# Patient Record
Sex: Female | Born: 1955 | Race: White | Hispanic: No | Marital: Married | Smoking: Former smoker
Health system: Southern US, Community
[De-identification: ages and names within clinical notes are randomized; demographics above are authoritative.]

## PROBLEM LIST (undated history)

## (undated) DIAGNOSIS — I10 Essential (primary) hypertension: Secondary | ICD-10-CM

## (undated) DIAGNOSIS — E785 Hyperlipidemia, unspecified: Secondary | ICD-10-CM

## (undated) DIAGNOSIS — F419 Anxiety disorder, unspecified: Secondary | ICD-10-CM

## (undated) DIAGNOSIS — E039 Hypothyroidism, unspecified: Secondary | ICD-10-CM

## (undated) DIAGNOSIS — E079 Disorder of thyroid, unspecified: Secondary | ICD-10-CM

## (undated) DIAGNOSIS — F329 Major depressive disorder, single episode, unspecified: Secondary | ICD-10-CM

## (undated) DIAGNOSIS — Z9889 Other specified postprocedural states: Secondary | ICD-10-CM

## (undated) DIAGNOSIS — K219 Gastro-esophageal reflux disease without esophagitis: Secondary | ICD-10-CM

## (undated) DIAGNOSIS — F32A Depression, unspecified: Secondary | ICD-10-CM

## (undated) DIAGNOSIS — N200 Calculus of kidney: Secondary | ICD-10-CM

## (undated) DIAGNOSIS — G4730 Sleep apnea, unspecified: Secondary | ICD-10-CM

## (undated) DIAGNOSIS — K589 Irritable bowel syndrome without diarrhea: Secondary | ICD-10-CM

## (undated) DIAGNOSIS — R112 Nausea with vomiting, unspecified: Secondary | ICD-10-CM

## (undated) DIAGNOSIS — C801 Malignant (primary) neoplasm, unspecified: Secondary | ICD-10-CM

## (undated) HISTORY — DX: Essential (primary) hypertension: I10

## (undated) HISTORY — DX: Gastro-esophageal reflux disease without esophagitis: K21.9

## (undated) HISTORY — DX: Disorder of thyroid, unspecified: E07.9

## (undated) HISTORY — PX: ABDOMINAL HYSTERECTOMY: SHX81

## (undated) HISTORY — PX: CHOLECYSTECTOMY: SHX55

## (undated) HISTORY — DX: Malignant (primary) neoplasm, unspecified: C80.1

## (undated) HISTORY — DX: Hyperlipidemia, unspecified: E78.5

## (undated) HISTORY — DX: Irritable bowel syndrome without diarrhea: K58.9

## (undated) HISTORY — DX: Morbid (severe) obesity due to excess calories: E66.01

---

## 2007-06-18 HISTORY — PX: THYROIDECTOMY: SHX17

## 2008-06-17 HISTORY — PX: NASAL SEPTUM SURGERY: SHX37

## 2011-08-28 ENCOUNTER — Encounter (INDEPENDENT_AMBULATORY_CARE_PROVIDER_SITE_OTHER): Payer: Self-pay | Admitting: Surgery

## 2011-08-28 ENCOUNTER — Ambulatory Visit (INDEPENDENT_AMBULATORY_CARE_PROVIDER_SITE_OTHER): Payer: Managed Care, Other (non HMO) | Admitting: Surgery

## 2011-08-28 NOTE — Progress Notes (Signed)
Re:   Joanna Meyer DOB:   January 08, 1956 MRN:   [id number]  ASSESSMENT AND PLAN: 1.  Morbid obesity, Wt - 253, BMI - 47.03  Somewhat undecided about which operation she wants.  I spent 30 minutes going over the options with her.  Per the 1991 NIH Consensus Statement, the patient is a candidate for bariatric surgery.  The patient attended our initial information session and reviewed the types of bariatric surgery.    The patient is uncertain between the Roux en Y Gastric Bypass and a Lap Band.  I discussed with the patient the indications and risks of bariatric surgery.  The potential risks of surgery include, but are not limited to, bleeding, infection, leak from the bowel, DVT and PE, open surgery, long term nutrition consequences, and death.  She was given permits for both operations.  She can make a decision when the surgery is posted.  The patient understands the importance of compliance and long term follow-up with our group after surgery.  From here we will obtain lab tests, x-rays, nutrition consult, and psych consult.  2.  Hypertension. 2.  Questionable sleep apnea, but she would not wear the device, so there is no reason to test her. 4.  Irritable bowel syndrome. 5.  GERD. 6.  Remote kidney stones. 7.  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 8.  Thyroid cancer - 2008.  Disease free.  Chief Complaint  Patient presents with  . Other    new bariatric- unsure of which procedure   REFERRING PHYSICIAN: Dr. Remus Blake Vurlicer, [place] (FAX: [phone]) and the insurance company.  HISTORY OF PRESENT ILLNESS: Joanna Meyer is a 56 y.o. (DOB: 1955/11/26)  white female whose primary care physician is Dr. Remus Blake Vurlicer, [place], [place]. (near [place]), and comes to me today for bariatric surgery.  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  She has been to our information session in her Dr. Andrey Campanile speak. Her son had a LAP-BAND  performed in [place] has lost over 100 pounds. She has coworkers who have had a bypass and a successful. Her one concern is that she does not like things on her skin.  She has tried multiple diets. This includes Weight Watchers (she tries almost every year), [place] weight loss, [employer], grapefruit diet, and lean line. She also tried Meridia and lost about 80 pounds, but gained it back.  She does have some GERD, but no gastric ulcer.  No history of liver disease.  History of cholecystectomy in the 1990's.  No history of pancreas disease.  No history of colon disease. She does have irritable bowel syndrome. She had an upper endo and a colonoscopy about 3 years ago.   Past Medical History  Diagnosis Date  . Thyroid disease   . IBS (irritable bowel syndrome)   . Hypertension   . Hyperlipidemia   . Cancer     thyroid  . GERD (gastroesophageal reflux disease)   . Arthritis       Past Surgical History  Procedure Date  . Cholecystectomy   . Abdominal hysterectomy   . Nasal septum surgery 2010  . Thyroidectomy       Current Outpatient Prescriptions  Medication Sig Dispense Refill  . amLODipine (NORVASC) 10 MG tablet daily.      . diphenoxylate-atropine (LOMOTIL) 2.5-0.025 MG per tablet Take 1 tablet by mouth 4 (four) times daily as needed.      Marland Kitchen  levothyroxine (SYNTHROID, LEVOTHROID) 175 MCG tablet Take 175 mcg by mouth daily.      Marland Kitchen losartan (COZAAR) 100 MG tablet daily.      Marland Kitchen venlafaxine (EFFEXOR-XR) 150 MG 24 hr capsule Take 150 mg by mouth daily.          Allergies  Allergen Reactions  . Tylenol (Acetaminophen)     REVIEW OF SYSTEMS: Skin:  No history of rash.  No history of abnormal moles. Infection:  No history of hepatitis or HIV.  No history of MRSA. Neurologic:  No history of stroke.  No history of seizure.  No history of headaches. Cardiac:  Hypertension x 5 years.. No history of heart disease.  No history of prior cardiac catheterization.  No history of  seeing a cardiologist. Pulmonary:  Does not smoke cigarettes.  No asthma or bronchitis.  No OSA/CPAP.  Endocrine:  No diabetes. No thyroid disease. Gastrointestinal:  See HPI. GYN:  History of hysterectomy. Urologic:  No history of kidney stones.  No history of bladder infections. Musculoskeletal: Knees and back ache, but she is not actively seeing a physician. Hematologic:  No bleeding disorder.  No history of anemia.  Not anticoagulated. Psycho-social:  The patient is oriented.   The patient has no obvious psychologic or social impairment to understanding our conversation and plan.  SOCIAL and FAMILY HISTORY: Married - husband with her.  He just had a subtotal colectomy. 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  PHYSICAL EXAM: BP 136/98  Pulse 92  Temp(Src) 98.2 F (36.8 C) (Temporal)  Resp 20  Ht 5' 1.5" (1.562 m)  Wt 253 lb (114.76 kg)  BMI 47.03 kg/m2  General: WN obese WF who is alert and generally healthy appearing.  HEENT: Normal. Pupils equal. Good dentition. Neck: Supple. No mass. Neck scar well healed. Carotid pulse okay with no bruit. Lymph Nodes:  No supraclavicular or cervical nodes. Lungs: Clear to auscultation and symmetric breath sounds. Heart:  RRR. No murmur or rub. Breasts:  No mass.  Abdomen: Soft. No mass. No tenderness. No hernia. Normal bowel sounds. Scars from lap chole.  More pear than apple.  Rectal: Not done, history of recent colonoscopy. Extremities:  Good strength and ROM  in upper and lower extremities. Neurologic:  Grossly intact to motor and sensory function. Psychiatric: Has normal mood and affect. Behavior is normal.   DATA REVIEWED: Chart records  Ovidio Kin, MD,  [facility], [place] [address] [place]  [address]   [place], [place]    [address] Phone:  [phone] FAX:  [phone]

## 2011-09-19 ENCOUNTER — Ambulatory Visit (HOSPITAL_COMMUNITY): Payer: Managed Care, Other (non HMO)

## 2011-10-04 ENCOUNTER — Ambulatory Visit (HOSPITAL_COMMUNITY)
Admission: RE | Admit: 2011-10-04 | Discharge: 2011-10-04 | Disposition: A | Discharge status: Home / Self Care | Payer: Managed Care, Other (non HMO) | Source: Ambulatory Visit | Attending: Surgery | Admitting: Surgery

## 2011-10-04 ENCOUNTER — Other Ambulatory Visit: Payer: Self-pay

## 2011-10-04 DIAGNOSIS — I10 Essential (primary) hypertension: Secondary | ICD-10-CM | POA: Insufficient documentation

## 2011-10-04 DIAGNOSIS — N200 Calculus of kidney: Secondary | ICD-10-CM | POA: Insufficient documentation

## 2011-10-04 DIAGNOSIS — Z6841 Body Mass Index (BMI) 40.0 and over, adult: Secondary | ICD-10-CM | POA: Insufficient documentation

## 2011-10-04 DIAGNOSIS — K589 Irritable bowel syndrome without diarrhea: Secondary | ICD-10-CM | POA: Insufficient documentation

## 2011-10-04 DIAGNOSIS — K219 Gastro-esophageal reflux disease without esophagitis: Secondary | ICD-10-CM | POA: Insufficient documentation

## 2011-10-22 ENCOUNTER — Encounter (HOSPITAL_COMMUNITY)
Admission: RE | Disposition: A | Discharge status: Home Health | Payer: Self-pay | Source: Ambulatory Visit | Attending: Surgery

## 2011-10-22 ENCOUNTER — Ambulatory Visit (HOSPITAL_COMMUNITY)
Admission: RE | Admit: 2011-10-22 | Discharge: 2011-10-22 | Disposition: A | Discharge status: Home Health | Payer: Managed Care, Other (non HMO) | Source: Ambulatory Visit | Attending: Surgery | Admitting: Surgery

## 2011-10-22 DIAGNOSIS — Z01818 Encounter for other preprocedural examination: Secondary | ICD-10-CM | POA: Insufficient documentation

## 2011-10-22 HISTORY — PX: BREATH TEK H PYLORI: SHX5422

## 2011-10-22 SURGERY — BREATH TEST, FOR HELICOBACTER PYLORI
Anesthesia: Choice

## 2011-10-23 ENCOUNTER — Encounter (HOSPITAL_COMMUNITY): Payer: Self-pay

## 2011-10-23 ENCOUNTER — Encounter (HOSPITAL_COMMUNITY): Payer: Self-pay | Admitting: Surgery

## 2011-11-12 ENCOUNTER — Telehealth (INDEPENDENT_AMBULATORY_CARE_PROVIDER_SITE_OTHER): Payer: Self-pay

## 2011-11-12 NOTE — Telephone Encounter (Signed)
I called the pt back.  She had her ugi and she has reflux.  She wants to know if Dr Ezzard Standing will treat her or should her medical md do it?  She has taken Nexium before but it didn't help.  Her md is Dr Kandice Hams [phone].

## 2011-11-12 NOTE — Telephone Encounter (Signed)
Message copied by Ivory Broad on Tue Nov 12, 2011 11:28 AM ------      Message from: Leandrew Koyanagi      Created: Tue Nov 12, 2011 11:07 AM      Regarding: RX question       Cathlean Sauer,            I told the patient I am not clinical so am unable to answer RX questions. Can you help her?            Thanks,            Okey Regal                  From: Stevan Born [[email or web address]]      Sent: Tuesday, Nov 12, 2011 10:53 AM      To: Leandrew Koyanagi      Subject: Medicine                  Please call me today at [phone] I need a guestion answered about a RX from the dr.

## 2011-11-13 ENCOUNTER — Encounter: Payer: Self-pay | Admitting: *Deleted

## 2011-11-13 ENCOUNTER — Encounter: Payer: Managed Care, Other (non HMO) | Attending: Surgery | Admitting: *Deleted

## 2011-11-13 DIAGNOSIS — Z01818 Encounter for other preprocedural examination: Secondary | ICD-10-CM | POA: Insufficient documentation

## 2011-11-13 DIAGNOSIS — Z713 Dietary counseling and surveillance: Secondary | ICD-10-CM | POA: Insufficient documentation

## 2011-11-13 NOTE — Progress Notes (Signed)
Medical Nutrition Therapy:  Appt start time:  930   End time:  1100.  Pre-Op Gastric Bypass Surgery Visit: 3rd Month Supervised Weight Loss RD taught how to eat; pt has been following portion control, reading food labels, how to shop in grocery store; made plan to decrease sweetened beverages and foods, increase exercise, and to avoid meal skipping. Plan included 1200 calories a day, better food choices, and exercise. Pt states she is trying to follow and has made changes to dietary intake. Pt reports extreme IBS with all foods - chronic, yet inconsistent; exacerbated by stress.   Pre-Op Assessment Visit:  Pre-Operative RYGB Surgery Patient was seen on 11/13/2011 for Pre-Operative RYGB Nutrition Assessment. Assessment and letter of approval faxed to [facility] B[facility]ric [facility] Program coordinator on 11/13/2011.  Approval letter sent to [facility] and will be available in the chart under the media tab.  TANITA  BODY COMP RESULTS  11/13/11   Fat Mass (lbs) 129.0   Fat Free Mass (lbs) 122.5   Total Body Water (lbs) 89.5   Handouts given during visit include: (additional handouts given d/t patient living in [place], [place])  Pre-Op Goals   Bariatric Surgery Protein Shakes  2 Week Bariatric Surgery Pre-Op Diet  Gastric Bypass Post-Op Diet  Gastric Bypass Patient Education Handbook  Samples given during visit include:  Celebrate Vitamins  MVI (pineapple-strawberry): 2 ea - Lot # [id number]; Exp: 09/14  Sublingual B-12: 3 ea - Lot # [id number]; Exp: 07/14 TwinLab Protein Powder  Chocolate: 1 pkt - Lot # [id number]; Exp: 10/14  Vanilla: 1 pkt - Lot # [id number]; Exp: 10/14 Unjury Protein Powder  Unflavored: 1 pkt - Lot # [id number]; Exp: 05/14  Chicken Soup: 2 pkts - Lot # [id number]; Exp: 01/14  Patient to call for Pre-Op and Post-Op Nutrition Education at [facility] when surgery is scheduled.

## 2011-11-13 NOTE — Patient Instructions (Signed)
   Follow Pre-Op Nutrition Goals to prepare for Gastric Bypass Surgery.   Call the [facility] at [phone] or (email [email or web address] once you have been given your surgery date to enrolled in the Pre-Op Nutrition Class. You will need to attend this nutrition class 3-4 weeks prior to your surgery.

## 2011-11-14 ENCOUNTER — Telehealth (INDEPENDENT_AMBULATORY_CARE_PROVIDER_SITE_OTHER): Payer: Self-pay

## 2011-11-14 NOTE — Telephone Encounter (Signed)
Per Dr Ezzard Standing, the patient's medical md needs to treat her reflux.  I faxed the ugi report to Dr Kandice Hams.  We will notify the patient.

## 2012-01-21 ENCOUNTER — Other Ambulatory Visit (INDEPENDENT_AMBULATORY_CARE_PROVIDER_SITE_OTHER): Payer: Self-pay | Admitting: Surgery

## 2012-01-28 ENCOUNTER — Encounter (HOSPITAL_COMMUNITY): Payer: Self-pay | Admitting: Pharmacy Technician

## 2012-01-29 ENCOUNTER — Encounter (INDEPENDENT_AMBULATORY_CARE_PROVIDER_SITE_OTHER): Payer: Self-pay | Admitting: Surgery

## 2012-01-29 ENCOUNTER — Ambulatory Visit (INDEPENDENT_AMBULATORY_CARE_PROVIDER_SITE_OTHER): Payer: Managed Care, Other (non HMO) | Admitting: Surgery

## 2012-01-29 DIAGNOSIS — Z6841 Body Mass Index (BMI) 40.0 and over, adult: Secondary | ICD-10-CM

## 2012-01-29 NOTE — Progress Notes (Addendum)
Re:   Joanna Meyer DOB:   1956/05/24 MRN:   [id number]  ASSESSMENT AND PLAN: 1.  Morbid obesity, Wt - 257, BMI - 47.8  She has decided to go ahead with the RYGB.  Per the 1991 NIH Consensus Statement, the patient is a candidate for bariatric surgery.  The patient attended our initial information session and reviewed the types of bariatric surgery.    I discussed with the patient the indications and risks of bariatric surgery.  The potential risks of surgery include, but are not limited to, bleeding, infection, leak from the bowel, DVT and PE, open surgery, long term nutrition consequences, and death.    She is scheduled for 02/10/2012 for RYGB.  I gave her the prescription for the bowel prep.  She has not started her pre op diet.  She as gained a little weight since I saw her, which I pointed out.  She comes with her husband.    2.  Hypertension. 2.  Questionable sleep apnea, but she would not wear the device, so there is no reason to test her. 4.  Irritable bowel syndrome. 5.  GERD. 6.  Remote kidney stones. 7.  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 8.  Thyroid cancer - 2008.  Disease free.  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  DN 02/13/2012]  Chief Complaint  Patient presents with  . Bariatric Pre-op   REFERRING PHYSICIAN: Dr. Remus Blake Vurlicer, [place], [place]. (FAX: [phone]) and the insurance company.  HISTORY OF PRESENT ILLNESS: Joanna Meyer is a 56 y.o. (DOB: 1956-01-01)  white female whose primary care physician is Dr. Remus Blake Vurlicer, [place], [place]. (near [place]), and comes to me today for bariatric surgery.  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  She has been to our information session in her Dr. Andrey Campanile speak. Her son had a LAP-BAND performed in [place] has lost over 100 pounds.   She decided on the RYGB, in part, because of her discussion with the psychologist.  I spent  time going over the operation, the post op recovery, and potential complications.  She seems to have a good handle on the surgery, but talked about not wanting to be in pain. She had trouble getting [place]  I told her I would err on the side of keeping her longer because she is from [place].  UGI - 10/04/2011 - small sliding HH [employer] - 10/22/2011 - neg Psych - Barnett Abu - supported  (heis in [place] and had a lap band!)  She does have some GERD, but no gastric ulcer.  History of cholecystectomy in the 1990's.  No history of pancreas disease.  No history of colon disease. She does have irritable bowel syndrome. She had an upper endo and a colonoscopy about 3 years ago.  She has had an abdominal hysterectomy.   Past Medical History  Diagnosis Date  . Thyroid disease   . IBS (irritable bowel syndrome)   . Hypertension   . Hyperlipidemia   . Cancer     thyroid  . GERD (gastroesophageal reflux disease)   . Arthritis   . Morbid obesity       Past Surgical History  Procedure Date  . Cholecystectomy   . Abdominal hysterectomy   . Nasal septum surgery 2010  . Thyroidectomy   . Breath tek h  pylori 10/22/2011    Procedure: BREATH TEK H PYLORI;  Surgeon: Kandis Cocking, MD;  Location: [facility];  Service: General;  Laterality: N/A;      Current Outpatient Prescriptions  Medication Sig Dispense Refill  . amLODipine (NORVASC) 10 MG tablet Take 10 mg by mouth every morning.       . diphenoxylate-atropine (LOMOTIL) 2.5-0.025 MG per tablet Take 1 tablet by mouth 4 (four) times daily as needed. Diarrhea      . levothyroxine (SYNTHROID, LEVOTHROID) 175 MCG tablet Take 175 mcg by mouth daily before breakfast.       . losartan (COZAAR) 100 MG tablet Take 100 mg by mouth every morning.       . Metaxalone (SKELAXIN PO) Take by mouth.      . venlafaxine (EFFEXOR-XR) 150 MG 24 hr capsule Take 150 mg by mouth every morning.           Allergies  Allergen  Reactions  . Tylenol (Acetaminophen) Nausea Only and Other (See Comments)    Passed out   Allergy to Tylenol is a little unclear.  REVIEW OF SYSTEMS:  Cardiac:  Hypertension x 5 years.. No history of heart disease.   GYN:  History of hysterectomy. Urologic:  No history of kidney stones.  No history of bladder infections. Musculoskeletal: Knees and back ache, but she is not actively seeing a physician. Psycho-social:  Saw Dr. Hermelinda Dellen of [facility].  SOCIAL and FAMILY HISTORY: Married - husband is with her.  He just had a subtotal colectomy. 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  PHYSICAL EXAM: BP 110/82  Pulse 102  Temp 97.7 F (36.5 C) (Oral)  Resp 24  Ht 5' 1.5" (1.562 m)  Wt 257 lb (116.574 kg)  BMI 47.77 kg/m2  General: WN obese WF who is alert and generally healthy appearing.  HEENT: Normal. Pupils equal. Good dentition. Neck: Supple. No mass. Neck scar well healed. Carotid pulse okay with no bruit. Lungs: Clear to auscultation and symmetric breath sounds. Heart:  RRR. No murmur or rub.  Abdomen: Soft. No mass. No tenderness. No hernia. Normal bowel sounds. Scars from lap chole.  More pear than apple.  Scar in the lower abdomen from hysterectomy. Extremities:  Good strength and ROM  in upper and lower extremities. Neurologic:  Grossly intact to motor and sensory function. Psychiatric: Has normal mood and affect. Behavior is normal.   DATA REVIEWED: See HPI.  Ovidio Kin, MD,  [facility] [place] [address] [place]  [address]   [place], [place]    [address] Phone:  [phone] FAX:  [phone]

## 2012-02-06 NOTE — Pre-Procedure Instructions (Signed)
10-04-2011 ekg eipic

## 2012-02-06 NOTE — Patient Instructions (Addendum)
[address]  02/06/2012   Your procedure is scheduled on:  02-10-2012  Report to [facility] at   [phone].  Call this number if you have problems the morning of surgery: [phone]   Remember:   Do not eat food or drink liquids:After Midnight.  .  Take these medicines the morning of surgery with A SIP OF WATER: effexor, norvasc, levothryoid   Do not wear jewelry or make up.  Do not wear lotions, powders, or perfumes.Do not wear deodorant.    Do not bring valuables to the hospital.  Contacts, dentures or bridgework may not be worn into surgery.  Leave suitcase in the car. After surgery it may be brought to your room.  For patients admitted to the hospital, checkout time is 11:00 AM the day of discharge                             Patients discharged the day of surgery will not be allowed to drive home. If going home same day of surgery, you must have someone stay with you the first 24 hours at home and arrange for some one to drive you home from hospital.    Special Instructions: CHG Shower Use Special Wash: 1/2 bottle night before surgery and 1/2 bottle morning  of surgery, use regular soap on face and front and back private area. Women do not shave legs or underarms for 2 days before showers. Men may shave face morning of surgery.    Please read over the following fact sheets that you were given: MRSA Information  [facility] pre op nurse phone number [phone], call if needed

## 2012-02-07 ENCOUNTER — Ambulatory Visit (HOSPITAL_COMMUNITY)
Admission: RE | Admit: 2012-02-07 | Discharge: 2012-02-07 | Disposition: A | Discharge status: Home / Self Care | Payer: Managed Care, Other (non HMO) | Source: Ambulatory Visit | Attending: Otolaryngology | Admitting: Otolaryngology

## 2012-02-07 ENCOUNTER — Encounter (HOSPITAL_COMMUNITY)
Admission: RE | Admit: 2012-02-07 | Discharge: 2012-02-07 | Disposition: A | Discharge status: Home / Self Care | Payer: Managed Care, Other (non HMO) | Source: Ambulatory Visit | Attending: Surgery | Admitting: Surgery

## 2012-02-07 ENCOUNTER — Encounter (HOSPITAL_COMMUNITY): Payer: Self-pay

## 2012-02-07 DIAGNOSIS — K219 Gastro-esophageal reflux disease without esophagitis: Secondary | ICD-10-CM | POA: Insufficient documentation

## 2012-02-07 DIAGNOSIS — I10 Essential (primary) hypertension: Secondary | ICD-10-CM | POA: Insufficient documentation

## 2012-02-07 DIAGNOSIS — Z01812 Encounter for preprocedural laboratory examination: Secondary | ICD-10-CM | POA: Insufficient documentation

## 2012-02-07 HISTORY — DX: Anxiety disorder, unspecified: F41.9

## 2012-02-07 HISTORY — DX: Sleep apnea, unspecified: G47.30

## 2012-02-07 HISTORY — DX: Hypothyroidism, unspecified: E03.9

## 2012-02-07 HISTORY — DX: Major depressive disorder, single episode, unspecified: F32.9

## 2012-02-07 HISTORY — DX: Nausea with vomiting, unspecified: R11.2

## 2012-02-07 HISTORY — DX: Depression, unspecified: F32.A

## 2012-02-07 HISTORY — DX: Calculus of kidney: N20.0

## 2012-02-07 HISTORY — DX: Other specified postprocedural states: Z98.890

## 2012-02-07 LAB — BASIC METABOLIC PANEL
CO2: 29 mEq/L (ref 19–32)
Calcium: 10.1 mg/dL (ref 8.4–10.5)
Chloride: 102 mEq/L (ref 96–112)
GFR calc Af Amer: >90 mL/min (ref >90)
Sodium: 139 mEq/L (ref 135–145)

## 2012-02-07 LAB — CBC
HCT: 40.9 % (ref 36.0–46.0)
Hemoglobin: 13.7 g/dL (ref 12.0–15.0)
WBC: 8.1 10*3/uL (ref 4.0–10.5)

## 2012-02-07 LAB — SURGICAL PCR SCREEN: Staphylococcus aureus: NEGATIVE

## 2012-02-07 NOTE — Progress Notes (Signed)
02/07/12 1124  OBSTRUCTIVE SLEEP APNEA  Have you ever been diagnosed with sleep apnea through a sleep study? No  Do you snore loudly (loud enough to be heard through closed doors)?  1  Do you often feel tired, fatigued, or sleepy during the daytime? 1  Has anyone observed you stop breathing during your sleep? 1  Do you have, or are you being treated for high blood pressure? 1  BMI more than 35 kg/m2? 1  Age over 54 years old? 1  Neck circumference greater than 40 cm/18 inches? 0  Gender: 0  Obstructive Sleep Apnea Score 6   Score 4 or greater  Updated health history;Results sent to PCP

## 2012-02-10 ENCOUNTER — Encounter (HOSPITAL_COMMUNITY)
Admission: RE | Disposition: A | Discharge status: Home / Self Care | Payer: Self-pay | Source: Ambulatory Visit | Attending: Surgery

## 2012-02-10 ENCOUNTER — Encounter (HOSPITAL_COMMUNITY): Payer: Self-pay | Admitting: *Deleted

## 2012-02-10 ENCOUNTER — Encounter (HOSPITAL_COMMUNITY): Payer: Self-pay | Admitting: Certified Registered Nurse Anesthetist

## 2012-02-10 ENCOUNTER — Encounter (HOSPITAL_COMMUNITY): Payer: Self-pay | Admitting: Surgery

## 2012-02-10 ENCOUNTER — Ambulatory Visit (HOSPITAL_COMMUNITY): Payer: Managed Care, Other (non HMO) | Admitting: Certified Registered Nurse Anesthetist

## 2012-02-10 ENCOUNTER — Inpatient Hospital Stay (HOSPITAL_COMMUNITY)
Admission: RE | Admit: 2012-02-10 | Discharge: 2012-02-13 | DRG: 621 | Disposition: A | Discharge status: Home / Self Care | Payer: Managed Care, Other (non HMO) | Source: Ambulatory Visit | Attending: Surgery | Admitting: Surgery

## 2012-02-10 DIAGNOSIS — Z79899 Other long term (current) drug therapy: Secondary | ICD-10-CM

## 2012-02-10 DIAGNOSIS — K219 Gastro-esophageal reflux disease without esophagitis: Secondary | ICD-10-CM | POA: Diagnosis present

## 2012-02-10 DIAGNOSIS — I10 Essential (primary) hypertension: Secondary | ICD-10-CM

## 2012-02-10 DIAGNOSIS — Z6841 Body Mass Index (BMI) 40.0 and over, adult: Secondary | ICD-10-CM

## 2012-02-10 DIAGNOSIS — E785 Hyperlipidemia, unspecified: Secondary | ICD-10-CM | POA: Diagnosis present

## 2012-02-10 DIAGNOSIS — Z85850 Personal history of malignant neoplasm of thyroid: Secondary | ICD-10-CM

## 2012-02-10 DIAGNOSIS — M479 Spondylosis, unspecified: Secondary | ICD-10-CM | POA: Diagnosis present

## 2012-02-10 DIAGNOSIS — K210 Gastro-esophageal reflux disease with esophagitis: Secondary | ICD-10-CM

## 2012-02-10 DIAGNOSIS — G4730 Sleep apnea, unspecified: Secondary | ICD-10-CM | POA: Diagnosis present

## 2012-02-10 DIAGNOSIS — D214 Benign neoplasm of connective and other soft tissue of abdomen: Secondary | ICD-10-CM

## 2012-02-10 DIAGNOSIS — M1710 Unilateral primary osteoarthritis, unspecified knee: Secondary | ICD-10-CM | POA: Diagnosis present

## 2012-02-10 DIAGNOSIS — Z87442 Personal history of urinary calculi: Secondary | ICD-10-CM

## 2012-02-10 HISTORY — PX: GASTRIC ROUX-EN-Y: SHX5262

## 2012-02-10 LAB — HEMOGLOBIN AND HEMATOCRIT, BLOOD
HCT: 37.5 % (ref 36.0–46.0)
Hemoglobin: 12.6 g/dL (ref 12.0–15.0)

## 2012-02-10 SURGERY — LAPAROSCOPIC ROUX-EN-Y GASTRIC
Anesthesia: General | Site: Abdomen | Wound class: Clean Contaminated

## 2012-02-10 MED ORDER — DIPHENHYDRAMINE HCL 50 MG/ML IJ SOLN
25.0000 mg | Freq: Four times a day (QID) | INTRAMUSCULAR | Status: DC | PRN
  Administered 2012-02-10 – 2012-02-13 (×7): 25 mg via INTRAVENOUS
  Filled 2012-02-10 (×7): qty 1

## 2012-02-10 MED ORDER — TISSEEL VH 10 ML EX KIT
PACK | CUTANEOUS | Status: AC
  Filled 2012-02-10: qty 1

## 2012-02-10 MED ORDER — CEFOXITIN SODIUM-DEXTROSE 1-4 GM-% IV SOLR (PREMIX)
INTRAVENOUS | Status: AC
  Filled 2012-02-10: qty 100

## 2012-02-10 MED ORDER — UNJURY CHOCOLATE CLASSIC POWDER
2.0000 [oz_av] | Freq: Four times a day (QID) | ORAL | Status: DC
  Administered 2012-02-12 (×4): 2 [oz_av] via ORAL

## 2012-02-10 MED ORDER — PROPOFOL 10 MG/ML IV EMUL
INTRAVENOUS | Status: DC | PRN
  Administered 2012-02-10: 200 mg via INTRAVENOUS

## 2012-02-10 MED ORDER — SUCCINYLCHOLINE CHLORIDE 20 MG/ML IJ SOLN
INTRAMUSCULAR | Status: DC | PRN
  Administered 2012-02-10: 100 mg via INTRAVENOUS

## 2012-02-10 MED ORDER — MUSCLE RUB 10-15 % EX CREA
TOPICAL_CREAM | CUTANEOUS | Status: DC | PRN
  Administered 2012-02-10: 16:00:00 via TOPICAL
  Filled 2012-02-10: qty 85

## 2012-02-10 MED ORDER — OXYCODONE-ACETAMINOPHEN 5-325 MG/5ML PO SOLN
5.0000 mL | ORAL | Status: DC | PRN
  Administered 2012-02-11 – 2012-02-13 (×6): 10 mL via ORAL
  Filled 2012-02-10 (×7): qty 10

## 2012-02-10 MED ORDER — DEXAMETHASONE SODIUM PHOSPHATE 10 MG/ML IJ SOLN
INTRAMUSCULAR | Status: DC | PRN
  Administered 2012-02-10: 10 mg via INTRAVENOUS

## 2012-02-10 MED ORDER — GLYCOPYRROLATE 0.2 MG/ML IJ SOLN
INTRAMUSCULAR | Status: DC | PRN
  Administered 2012-02-10: .6 mg via INTRAVENOUS

## 2012-02-10 MED ORDER — MIDAZOLAM HCL 5 MG/5ML IJ SOLN
INTRAMUSCULAR | Status: DC | PRN
  Administered 2012-02-10: 2 mg via INTRAVENOUS

## 2012-02-10 MED ORDER — LACTATED RINGERS IR SOLN
Status: DC | PRN
  Administered 2012-02-10: 3000 mL

## 2012-02-10 MED ORDER — ONDANSETRON HCL 4 MG/2ML IJ SOLN
4.0000 mg | INTRAMUSCULAR | Status: DC | PRN
  Administered 2012-02-10: 4 mg via INTRAVENOUS
  Filled 2012-02-10: qty 2

## 2012-02-10 MED ORDER — SCOPOLAMINE 1 MG/3DAYS TD PT72
1.0000 | MEDICATED_PATCH | Freq: Once | TRANSDERMAL | Status: DC | PRN
  Administered 2012-02-10: 1.5 mg via TRANSDERMAL

## 2012-02-10 MED ORDER — ACETAMINOPHEN 160 MG/5ML PO SOLN: 650.0000 mg | ORAL | Status: DC | PRN

## 2012-02-10 MED ORDER — BUPIVACAINE HCL (PF) 0.25 % IJ SOLN
INTRAMUSCULAR | Status: AC
  Filled 2012-02-10: qty 30

## 2012-02-10 MED ORDER — MORPHINE SULFATE 2 MG/ML IJ SOLN
2.0000 mg | INTRAMUSCULAR | Status: DC | PRN
  Administered 2012-02-10 (×2): 4 mg via INTRAVENOUS
  Administered 2012-02-10 – 2012-02-11 (×4): 6 mg via INTRAVENOUS
  Filled 2012-02-10: qty 3
  Filled 2012-02-10 (×2): qty 2
  Filled 2012-02-10 (×5): qty 3

## 2012-02-10 MED ORDER — LIDOCAINE HCL (CARDIAC) 20 MG/ML IV SOLN
INTRAVENOUS | Status: DC | PRN
  Administered 2012-02-10: 100 mg via INTRAVENOUS

## 2012-02-10 MED ORDER — BUPIVACAINE HCL (PF) 0.25 % IJ SOLN
INTRAMUSCULAR | Status: DC | PRN
  Administered 2012-02-10: 30 mL

## 2012-02-10 MED ORDER — FENTANYL CITRATE 0.05 MG/ML IJ SOLN
INTRAMUSCULAR | Status: DC | PRN
  Administered 2012-02-10 (×4): 50 ug via INTRAVENOUS
  Administered 2012-02-10: 100 ug via INTRAVENOUS
  Administered 2012-02-10: 50 ug via INTRAVENOUS
  Administered 2012-02-10: 100 ug via INTRAVENOUS
  Administered 2012-02-10: 50 ug via INTRAVENOUS

## 2012-02-10 MED ORDER — LACTATED RINGERS IV SOLN: INTRAVENOUS | Status: DC

## 2012-02-10 MED ORDER — UNJURY CHICKEN SOUP POWDER: 2.0000 [oz_av] | Freq: Four times a day (QID) | ORAL | Status: DC

## 2012-02-10 MED ORDER — PROMETHAZINE HCL 25 MG/ML IJ SOLN: 6.2500 mg | INTRAMUSCULAR | Status: DC | PRN

## 2012-02-10 MED ORDER — ACETAMINOPHEN 10 MG/ML IV SOLN
1000.0000 mg | Freq: Four times a day (QID) | INTRAVENOUS | Status: AC
  Administered 2012-02-10 – 2012-02-11 (×2): 1000 mg via INTRAVENOUS
  Filled 2012-02-10 (×4): qty 100

## 2012-02-10 MED ORDER — KCL IN DEXTROSE-NACL 20-5-0.45 MEQ/L-%-% IV SOLN
INTRAVENOUS | Status: DC
  Administered 2012-02-10 – 2012-02-13 (×6): via INTRAVENOUS
  Filled 2012-02-10 (×10): qty 1000

## 2012-02-10 MED ORDER — HYDROMORPHONE HCL PF 1 MG/ML IJ SOLN
INTRAMUSCULAR | Status: DC | PRN
  Administered 2012-02-10 (×2): 1 mg via INTRAVENOUS

## 2012-02-10 MED ORDER — OXYCODONE HCL 5 MG PO TABS: 5.0000 mg | ORAL_TABLET | Freq: Once | ORAL | Status: DC | PRN

## 2012-02-10 MED ORDER — OXYCODONE-ACETAMINOPHEN 5-325 MG/5ML PO SOLN: 5.0000 mL | ORAL | Status: DC | PRN

## 2012-02-10 MED ORDER — ONDANSETRON HCL 4 MG/2ML IJ SOLN
INTRAMUSCULAR | Status: DC | PRN
  Administered 2012-02-10: 4 mg via INTRAVENOUS

## 2012-02-10 MED ORDER — OXYCODONE HCL 5 MG/5ML PO SOLN
5.0000 mg | Freq: Once | ORAL | Status: DC | PRN
  Filled 2012-02-10: qty 5

## 2012-02-10 MED ORDER — TISSEEL VH 10 ML EX KIT
PACK | CUTANEOUS | Status: DC | PRN
  Administered 2012-02-10: 10 mL

## 2012-02-10 MED ORDER — DEXTROSE 5 % IV SOLN
2.0000 g | INTRAVENOUS | Status: AC
  Administered 2012-02-10: 2 g via INTRAVENOUS

## 2012-02-10 MED ORDER — LACTATED RINGERS IV SOLN
INTRAVENOUS | Status: DC | PRN
  Administered 2012-02-10 (×4): via INTRAVENOUS

## 2012-02-10 MED ORDER — DEXTROSE 5 % IV SOLN
1.0000 g | Freq: Three times a day (TID) | INTRAVENOUS | Status: AC
  Administered 2012-02-10 (×2): 1 g via INTRAVENOUS
  Filled 2012-02-10 (×2): qty 1

## 2012-02-10 MED ORDER — ROCURONIUM BROMIDE 100 MG/10ML IV SOLN
INTRAVENOUS | Status: DC | PRN
  Administered 2012-02-10: 10 mg via INTRAVENOUS
  Administered 2012-02-10: 20 mg via INTRAVENOUS
  Administered 2012-02-10: 10 mg via INTRAVENOUS
  Administered 2012-02-10: 50 mg via INTRAVENOUS

## 2012-02-10 MED ORDER — HEPARIN SODIUM (PORCINE) 5000 UNIT/ML IJ SOLN
5000.0000 [IU] | Freq: Once | INTRAMUSCULAR | Status: AC
  Administered 2012-02-10: 5000 [IU] via SUBCUTANEOUS
  Filled 2012-02-10: qty 1

## 2012-02-10 MED ORDER — HEPARIN SODIUM (PORCINE) 5000 UNIT/ML IJ SOLN
5000.0000 [IU] | Freq: Three times a day (TID) | INTRAMUSCULAR | Status: DC
  Administered 2012-02-10 – 2012-02-13 (×9): 5000 [IU] via SUBCUTANEOUS
  Filled 2012-02-10 (×12): qty 1

## 2012-02-10 MED ORDER — KCL IN DEXTROSE-NACL 20-5-0.45 MEQ/L-%-% IV SOLN
INTRAVENOUS | Status: AC
  Filled 2012-02-10: qty 1000

## 2012-02-10 MED ORDER — MEPERIDINE HCL 50 MG/ML IJ SOLN: 6.2500 mg | INTRAMUSCULAR | Status: DC | PRN

## 2012-02-10 MED ORDER — ONDANSETRON HCL 4 MG/2ML IJ SOLN
INTRAMUSCULAR | Status: AC
  Administered 2012-02-10: 4 mg
  Filled 2012-02-10: qty 2

## 2012-02-10 MED ORDER — HYDROMORPHONE HCL PF 1 MG/ML IJ SOLN: 0.2500 mg | INTRAMUSCULAR | Status: DC | PRN

## 2012-02-10 MED ORDER — NEOSTIGMINE METHYLSULFATE 1 MG/ML IJ SOLN
INTRAMUSCULAR | Status: DC | PRN
  Administered 2012-02-10: 5 mg via INTRAVENOUS

## 2012-02-10 MED ORDER — UNJURY VANILLA POWDER: 2.0000 [oz_av] | Freq: Four times a day (QID) | ORAL | Status: DC

## 2012-02-10 MED ORDER — SCOPOLAMINE 1 MG/3DAYS TD PT72
MEDICATED_PATCH | TRANSDERMAL | Status: AC
  Filled 2012-02-10: qty 1

## 2012-02-10 MED ORDER — ONDANSETRON HCL 4 MG/2ML IJ SOLN: 4.0000 mg | INTRAMUSCULAR | Status: DC | PRN

## 2012-02-10 SURGICAL SUPPLY — 70 items
APPLICATOR COTTON TIP 6IN STRL (MISCELLANEOUS) IMPLANT
APPLIER CLIP ROT 13.4 12 LRG (CLIP)
BLADE SURG 15 STRL LF DISP TIS (BLADE) ×2 IMPLANT
BLADE SURG 15 STRL SS (BLADE) ×1
CABLE HIGH FREQUENCY MONO STRZ (ELECTRODE) ×6 IMPLANT
CANISTER SUCTION 2500CC (MISCELLANEOUS) ×3 IMPLANT
CLIP APPLIE ROT 13.4 12 LRG (CLIP) IMPLANT
CLIP SUT LAPRA TY ABSORB (SUTURE) ×6 IMPLANT
CLOTH BEACON ORANGE TIMEOUT ST (SAFETY) ×3 IMPLANT
CUTTER LINEAR ENDO ART 45 ETS (STAPLE) ×3 IMPLANT
DECANTER SPIKE VIAL GLASS SM (MISCELLANEOUS) ×3 IMPLANT
DERMABOND ADVANCED (GAUZE/BANDAGES/DRESSINGS) ×1
DERMABOND ADVANCED .7 DNX12 (GAUZE/BANDAGES/DRESSINGS) ×2 IMPLANT
DEVICE SUTURE ENDOST 10MM (ENDOMECHANICALS) ×3 IMPLANT
DISSECTOR BLUNT TIP ENDO 5MM (MISCELLANEOUS) IMPLANT
DRAIN PENROSE 18X1/4 LTX STRL (WOUND CARE) ×3 IMPLANT
DRAPE CAMERA CLOSED 9X96 (DRAPES) ×3 IMPLANT
DUPLOJECT EASY PREP 4ML (MISCELLANEOUS) ×3 IMPLANT
GAUZE SPONGE 4X4 16PLY XRAY LF (GAUZE/BANDAGES/DRESSINGS) ×3 IMPLANT
GLOVE BIOGEL PI IND STRL 7.0 (GLOVE) ×2 IMPLANT
GLOVE BIOGEL PI INDICATOR 7.0 (GLOVE) ×1
GLOVE SURG SIGNA 7.5 PF LTX (GLOVE) ×6 IMPLANT
GOWN STRL NON-REIN LRG LVL3 (GOWN DISPOSABLE) ×3 IMPLANT
GOWN STRL REIN XL XLG (GOWN DISPOSABLE) ×15 IMPLANT
HOVERMATT SINGLE USE (MISCELLANEOUS) ×3 IMPLANT
KIT BASIN OR (CUSTOM PROCEDURE TRAY) ×3 IMPLANT
KIT GASTRIC LAVAGE 34FR ADT (SET/KITS/TRAYS/PACK) ×3 IMPLANT
NEEDLE SPNL 22GX3.5 QUINCKE BK (NEEDLE) ×3 IMPLANT
NS IRRIG 1000ML POUR BTL (IV SOLUTION) ×3 IMPLANT
PACK CARDIOVASCULAR III (CUSTOM PROCEDURE TRAY) ×3 IMPLANT
PEN SKIN MARKING BROAD (MISCELLANEOUS) ×3 IMPLANT
POUCH SPECIMEN RETRIEVAL 10MM (ENDOMECHANICALS) ×3 IMPLANT
RELOAD 45 VASCULAR/THIN (ENDOMECHANICALS) ×6 IMPLANT
RELOAD BLUE (STAPLE) ×6 IMPLANT
RELOAD ENDO STITCH 2.0 (ENDOMECHANICALS) ×11
RELOAD GOLD (STAPLE) IMPLANT
RELOAD STAPLE TA45 3.5 REG BLU (ENDOMECHANICALS) ×12 IMPLANT
RELOAD WHITE ECR60W (STAPLE) IMPLANT
SCALPEL HARMONIC ACE (MISCELLANEOUS) IMPLANT
SCISSORS LAP 5X35 DISP (ENDOMECHANICALS) ×3 IMPLANT
SEALANT SURGICAL APPL DUAL CAN (MISCELLANEOUS) ×3 IMPLANT
SET IRRIG TUBING LAPAROSCOPIC (IRRIGATION / IRRIGATOR) ×3 IMPLANT
SLEEVE Z-THREAD 12X100MM (TROCAR) ×9 IMPLANT
SLEEVE Z-THREAD 5X100MM (TROCAR) ×3 IMPLANT
SOLUTION ANTI FOG 6CC (MISCELLANEOUS) ×3 IMPLANT
SPONGE GAUZE 4X4 12PLY (GAUZE/BANDAGES/DRESSINGS) ×3 IMPLANT
STAPLER STANDARD HANDLE (STAPLE) ×3 IMPLANT
STAPLER VISISTAT 35W (STAPLE) ×3 IMPLANT
SUT MON AB 5-0 PS2 18 (SUTURE) ×3 IMPLANT
SUT RELOAD ENDO STITCH 2 48X1 (ENDOMECHANICALS) ×14
SUT RELOAD ENDO STITCH 2.0 (ENDOMECHANICALS) ×8
SUT VIC AB 2-0 SH 27 (SUTURE) ×2
SUT VIC AB 2-0 SH 27X BRD (SUTURE) ×4 IMPLANT
SUTURE RELOAD END STTCH 2 48X1 (ENDOMECHANICALS) ×14 IMPLANT
SUTURE RELOAD ENDO STITCH 2.0 (ENDOMECHANICALS) ×8 IMPLANT
SYR 20CC LL (SYRINGE) ×3 IMPLANT
SYR 50ML LL SCALE MARK (SYRINGE) ×3 IMPLANT
SYR CONTROL 10ML LL (SYRINGE) ×6 IMPLANT
TOWEL OR 17X26 10 PK STRL BLUE (TOWEL DISPOSABLE) ×6 IMPLANT
TOWEL OR NON WOVEN STRL DISP B (DISPOSABLE) ×3 IMPLANT
TRAY FOLEY CATH 14FRSI W/METER (CATHETERS) ×3 IMPLANT
TROCAR XCEL 12X100 BLDLESS (ENDOMECHANICALS) ×3 IMPLANT
TROCAR XCEL NON-BLD 5MMX100MML (ENDOMECHANICALS) ×3 IMPLANT
TROCAR Z-THREAD FIOS 11X100 BL (TROCAR) ×3 IMPLANT
TROCAR Z-THREAD FIOS 12X100MM (TROCAR) ×3 IMPLANT
TROCAR Z-THREAD FIOS 5X100MM (TROCAR) ×3 IMPLANT
TUBING ENDO SMARTCAP (MISCELLANEOUS) ×3 IMPLANT
TUBING FILTER THERMOFLATOR (ELECTROSURGICAL) ×3 IMPLANT
WARMER LAPAROSCOPE (MISCELLANEOUS) IMPLANT
WATER STERILE IRR 1500ML POUR (IV SOLUTION) ×3 IMPLANT

## 2012-02-10 NOTE — Progress Notes (Signed)
Dr. Ezzard Standing made aware of patient's Tylenol allergy- Tylenol poisoning- and medications  That have been ordered- Tylenol and those with Tylenol in them- Dr. Ezzard Standing to take care of it after he comes out of surgery.

## 2012-02-10 NOTE — Anesthesia Postprocedure Evaluation (Signed)
Anesthesia Post Note  Patient: Joanna Meyer  Procedure(s) Performed: Procedure(s) (LRB): LAPAROSCOPIC ROUX-EN-Y GASTRIC (N/A)  Anesthesia type: General  Patient location: PACU  Post pain: Pain level controlled  Post assessment: Post-op Vital signs reviewed  Last Vitals: BP 122/75  Pulse 83  Temp 36.4 C (Oral)  Resp 16  Ht 5\' 1"  (1.549 m)  Wt 244 lb 8 oz (110.904 kg)  BMI 46.20 kg/m2  SpO2 94%  Post vital signs: Reviewed  Level of consciousness: sedated  Complications: No apparent anesthesia complications

## 2012-02-10 NOTE — Anesthesia Preprocedure Evaluation (Addendum)
Anesthesia Evaluation  Patient identified by MRN, date of birth, ID band Patient awake    Reviewed: Allergy & Precautions, H&P , NPO status , Patient's Chart, lab work & pertinent test results  History of Anesthesia Complications (+) PONV  Airway Mallampati: II TM Distance: >3 FB Neck ROM: Full    Dental  (+) Teeth Intact and Dental Advisory Given   Pulmonary sleep apnea ,  breath sounds clear to auscultation  Pulmonary exam normal       Cardiovascular hypertension, Pt. on medications Rhythm:Regular     Neuro/Psych Anxiety    GI/Hepatic Neg liver ROS, GERD-  Medicated,  Endo/Other  Hypothyroidism Morbid obesity  Renal/GU      Musculoskeletal   Abdominal (+) + obese,   Peds  Hematology negative hematology ROS (+)   Anesthesia Other Findings   Reproductive/Obstetrics                         Anesthesia Physical Anesthesia Plan  ASA: III  Anesthesia Plan: General   Post-op Pain Management:    Induction: Intravenous, Rapid sequence and Cricoid pressure planned  Airway Management Planned: Oral ETT  Additional Equipment:   Intra-op Plan:   Post-operative Plan: Extubation in OR  Informed Consent: I have reviewed the patients History and Physical, chart, labs and discussed the procedure including the risks, benefits and alternatives for the proposed anesthesia with the patient or authorized representative who has indicated his/her understanding and acceptance.   Dental advisory given  Plan Discussed with: CRNA and Surgeon  Anesthesia Plan Comments:        Anesthesia Quick Evaluation

## 2012-02-10 NOTE — Progress Notes (Signed)
Foley catheter removed without difficulty- 1,000 cc of urine in bag

## 2012-02-10 NOTE — Interval H&P Note (Signed)
History and Physical Interval Note:  02/10/2012 7:13 AM  Joanna Meyer  has presented today for surgery, with the diagnosis of morbid obesity   The various methods of treatment have been discussed with the patient and family.  Her husband is here today.  After consideration of risks, benefits and other options for treatment, the patient has consented to  Procedure(s) (LRB): LAPAROSCOPIC ROUX-EN-Y GASTRIC (N/A) as a surgical intervention .    The patient's history has been reviewed, patient examined, no change in status, stable for surgery.  I have reviewed the patient's chart and labs.  Questions were answered to the patient's satisfaction.     Deshae Dickison H

## 2012-02-10 NOTE — Preoperative (Signed)
Beta Blockers   Reason not to administer Beta Blockers:took all meds this morning

## 2012-02-10 NOTE — Op Note (Signed)
PATIENT:   Chany Woolworth DOB:   09-Jun-1956 MRN:   [id number]  DATE OF PROCEDURE: 02/10/2012                   FACILITY:  [facility]  OPERATIVE REPORT  PREOPERATIVE DIAGNOSIS:  Morbid obesity.  POSTOPERATIVE DIAGNOSIS:  Morbid obesity (weight 257, BMI of 47.8), 8 mm anterior gastric wall tumor.  PROCEDURE:  Laparoscopic Roux-en-Y gastric bypass (intraoperative upper endoscopy by Dr. Andrey Campanile), resection of anterior gastric wall tumor.  SURGEON:  Sandria Bales. Ezzard Standing, MD  FIRST ASSISTANTFredonia Highland, MD  ANESTHESIA:  General endotracheal.  Junious Silk - CRNA Gaylan Gerold, MD - Anesthesiologist Mechele Dawley, CRNA - CRNA  General  ESTIMATED BLOOD LOSS:  Minimal.  LOCAL ANESTHESIA:  30 cc of 1/4% Marcaine  COMPLICATIONS:  None.  INDICATION FOR SURGERY:  Lakindra Wible is a 56 y.o. white female who sees Vurlicer, Benny Lennert, MD as her primary care doctor.  She has completed our preoperative bariatric program and now comes for a laparoscopic Roux-en-Y gastric bypass.  The indications, potential complications of surgery explained to the patient.  Potential complications of the surgery include, but are not limited to, bleeding, infection, DVT, open surgery, and long-term nutritional consequences.  OPERATIVE NOTE:  The patient taken to room #1 where she went underwent a general endotracheal anesthetic, supervised by Junious Silk - CRNA and Gaylan Gerold, MD - Anesthesiologist.  The patient was given 2 g of cefoxitin at the beginning of the procedure.  A time-out was held and surgical checklist run.  The abdomen was prepped with ChloraPrep and sterilely draped.  I accessed the abdominal cavity through the left upper quadrant using a 12 mm Optiview trocar.  I placed 6 additional trocars: 5 mm subxiphoid, 12 mm right subcostal, 12 mm right paramedian, 12 mm left paramedian, 5 mm lateral subcostal, and a 11 mm below to the right of the umbilicus.  The abdomen was insufflated and  abdominal exploration carried out.  Right and left lobes of liver unremarkable.  The stomach had an 8 mm tumor on the anterior surface, lateral towards the greater curvature.  She had a moderate amount of greater omentum which draped over her bowel.  I was able to push the omentum and transverse colon up and identified the ligament of Treitz to start the operation.  I measured 40 cm of the jejunum, starting at the ligament of Tritz, and divided the jejunum with a white load of 45 mm Ethicon Endo-GIA stapler.  I divided a short length into the mesentery.  I measured 100 cm of jejunum for the future gastric limb.  I put a Penrose drain on the future gastric limb of the jejunum.  I then did a side-to-side jejunojejunostomy.  I used a 45 mm white load.  I closed the enterotomy with 2 running 2-0 Vicryl sutures.  I tested the JJ anastomosis with an alligator forceps and then covered this with Tisseel.  I closed the mesenteric defect with a running 2-0 silk suture with a Laparo-tye on each end.  I then divided the omentum with a Harmonic Scalpel.  I placed the patient in reverse Trendelenburg and placed liver retractor under the left lobe of the liver.  I then identified the gastroesophageal junction.  I went to the left at the angle of His and made a window at the esophago-gastric junction for a target as my dissection.  I then went on the lesser curve of the stomach, counted  5 cm from the gastroesophageal junction down the lesser curve and dissected into the lesser sac.  I did the first firing of a 45 mm blue load Ethicon Endo-GIA stapler and then did 2 firings of the 60 mm blue load Ethicon Eschelon stapler.  I then did one more firing of the 45 mm blue load Ethicon Endo-GIA stapler to divide the stomach  This created a gastric pouch approximately 5 cm in length and 3 cm in width.  There was no bleeding from either the pouch or the stomach remnant site.  I placed Tisseel on the pouch side along the  new greater curvature.  I then resected the tumor of the anterior gastric wall with a single firing of the 45 mm blue load Ethicon Endo-GIA stapler.  The specimen was placed in an endocatch bag and sent to pathology.  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  I over sewed the gastric remnant with a locking 2-0 Vicryl suture with a Laparo-tye on each end..  I then brought the jejunum ante-colic, ante-gastric up to the new stomach pouch and placed a posterior running 2-0 Vicryl suture.  I then made an enterotomy into the stomach using the Ewald as a back stop and an enterotomy into the jejunum.  At this point I accidentally cut the posterior layer suture, but was able to resew it.  I did a stapled side-to-side gastrojejunal anastomosis with a 45 mm blue load of the Ethicon Endo GIA stapler.  This created about a 2.5 cm gastrojejunal anastomosis.  I closed the enterotomy with a 2 running 2-0 Vicryl sutures.  I passed the Ewald tube through the gastrojejunal anastomosis and then did an anterior Connell suture running of 2-0 Vicryl suture for the anterior layer of the gastrojejunostomy.  The Ewald tube was then removed without difficulty.  I then closed the [place] defect with a figure-of-eight 2-0 silk suture between the mesentery of the transverse colon and the mesentery of the distal jejunum.  Dr. Andrey Campanile then scrubbed out and did an intraoperative upper endoscopy.  He identified the esophagogastric junction about 40 cm, the gastrojejunal anastomosis about 45 cm.  I clamped off the small bowel.  He insufflated air and I flooded the abdomen with saline. There was no bubbling or evidence of air leak.  He then withdrew the scope and he will dictate that portion of the operation.    I then re-inspected the anastomoses, sucked out the saline, placed Tisseel over the stomach pouch and gastrojejunal anastomosis.   The liver retractor was removed.  The trocars were removed.  There was no  bleeding at any trocar site.  The skin at each trocar site was closed with a 5-0 Monocryl suture.  I infiltrated a total 30 cc of 0.25% Marcaine at the trocar sites.    After the skin incisions were closed with sutures they were painted with Dermabond.  The sponge and needle count were correct at the end of the case.  The patient tolerated the procedure well, was transported to the recovery room in good condition.   Ovidio Kin, MD, [facility] Surgery Pager: [phone] Office phone:  [phone]

## 2012-02-10 NOTE — Op Note (Signed)
Preoperative diagnosis: Morbid Obesity BMI 46  Postoperative diagnosis: Same   Procedure: Esophagogastrojejunoscopy   Surgeon: Mary Sella. Jaclene Bartelt M.D., FACS   Anesthesia: Gen.   Indications for procedure: 56 yo morbidly obese WF undergoing Laparoscopic roux-en-y gastric bypass  Description of procedure: After we have completed the new gastrojejunostomy, I scrubbed out and obtained the Olympus endoscope. I gently placed endoscope in the patient's oropharynx and gently glided it down the esophagus without any difficulty under direct visualization. The esophagus appeared slightly patulous. Once I was in the gastric pouch, I insufflated the pouch was air. The pouch was approximately 5 cm in size. Ge junction was at 41 cm. Gastrojejunal anastomosis was at 46cm.  I was able to cannulate and advanced the scope through the gastrojejunostomy. Dr. Ezzard Standing had placed saline in the upper abdomen. Upon further insufflation of the gastric pouch there was no evidence of bubbles. Upon further inspection of the gastric pouch, the mucosa appeared normal. There is no evidence of any mucosal abnormality. The gastric pouch and Roux limb were decompressed. The width of the gastrojejunal anastomosis was at least 2.5 cm. The scope was withdrawn. The patient tolerated this portion of the procedure well. Please see Dr Butch Penny operative note for details regarding the laparoscopic roux-en-y gastric bypass.  Mary Sella. Andrey Campanile, MD, FACS General, Bariatric, & Minimally Invasive Surgery [facility] Surgery, [place]

## 2012-02-10 NOTE — Transfer of Care (Signed)
Immediate Anesthesia Transfer of Care Note  Patient: Joanna Meyer  Procedure(s) Performed: Procedure(s) (LRB): LAPAROSCOPIC ROUX-EN-Y GASTRIC (N/A)  Patient Location: PACU  Anesthesia Type: General  Level of Consciousness: awake, alert  and oriented  Airway & Oxygen Therapy: Patient Spontanous Breathing and Patient connected to face mask oxygen  Post-op Assessment: Report given to PACU RN and Post -op Vital signs reviewed and stable  Post vital signs: Reviewed and stable  Complications: No apparent anesthesia complications

## 2012-02-10 NOTE — Progress Notes (Signed)
NOS  Standing.  Husband in room. Had some nausea earlier, better now. Main complaint is back pain (possibly from laying on the table for a while).  Bengay ordered for back.  Discussed tylenol allergy.  I'm not sure this is an allergy.  She was taking frequent pain meds about 5 years ago and they think the frequency of the tylenol cause some pain.  She thinks she has taken some cold meds that have tylenol, without difficulty.  Ovidio Kin, MD, [facility] Surgery Pager: [phone] Office phone:  [phone]

## 2012-02-10 NOTE — H&P (View-Only) (Signed)
Re:   Joanna Meyer DOB:   January 04, 1956 MRN:   [id number]  ASSESSMENT AND PLAN: 1.  Morbid obesity, Wt - 257, BMI - 47.8  She has decided to go ahead with the RYGB.  Per the 1991 NIH Consensus Statement, the patient is a candidate for bariatric surgery.  The patient attended our initial information session and reviewed the types of bariatric surgery.    I discussed with the patient the indications and risks of bariatric surgery.  The potential risks of surgery include, but are not limited to, bleeding, infection, leak from the bowel, DVT and PE, open surgery, long term nutrition consequences, and death.    She is scheduled for 02/10/2012 for RYGB.  I gave her the prescription for the bowel prep.  She has not started her pre op diet.  She as gained a little weight since I saw her, which I pointed out.  She comes with her husband.    2.  Hypertension. 2.  Questionable sleep apnea, but she would not wear the device, so there is no reason to test her. 4.  Irritable bowel syndrome. 5.  GERD. 6.  Remote kidney stones. 7.  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 8.  Thyroid cancer - 2008.  Disease free.  Chief Complaint  Patient presents with  . Bariatric Pre-op   REFERRING PHYSICIAN: Dr. Remus Blake Vurlicer, [place], [place]. (FAX: [phone]) and the insurance company.  HISTORY OF PRESENT ILLNESS: Joanna Meyer is a 56 y.o. (DOB: Nov 06, 1955)  white female whose primary care physician is Dr. Remus Blake Vurlicer, [place], [place]. (near [place]), and comes to me today for bariatric surgery.  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  She has been to our information session in her Dr. Andrey Campanile speak. Her son had a LAP-BAND performed in [place] has lost over 100 pounds.   She decided on the RYGB, in part, because of her discussion with the psychologist.  I spent time going over the operation, the post op recovery, and potential complications.  She seems to  have a good handle on the surgery, but talked about not wanting to be in pain. She had trouble getting [place] [address]  I told her I would err on the side of keeping her longer because she is from [place].  UGI - 10/04/2011 - small sliding HH [employer] - 10/22/2011 - neg Psych - Barnett Abu - supported  (heis in [place] and had a lap band!)  She does have some GERD, but no gastric ulcer.  History of cholecystectomy in the 1990's.  No history of pancreas disease.  No history of colon disease. She does have irritable bowel syndrome. She had an upper endo and a colonoscopy about 3 years ago.  She has had an abdominal hysterectomy.   Past Medical History  Diagnosis Date  . Thyroid disease   . IBS (irritable bowel syndrome)   . Hypertension   . Hyperlipidemia   . Cancer     thyroid  . GERD (gastroesophageal reflux disease)   . Arthritis   . Morbid obesity       Past Surgical History  Procedure Date  . Cholecystectomy   . Abdominal hysterectomy   . Nasal septum surgery 2010  . Thyroidectomy   . Breath tek h pylori 10/22/2011    Procedure: BREATH TEK H PYLORI;  Surgeon: Kandis Cocking, MD;  Location: [facility];  Service: General;  Laterality: N/A;      Current Outpatient Prescriptions  Medication Sig Dispense Refill  . amLODipine (NORVASC) 10 MG tablet Take 10 mg by mouth every morning.       . diphenoxylate-atropine (LOMOTIL) 2.5-0.025 MG per tablet Take 1 tablet by mouth 4 (four) times daily as needed. Diarrhea      . levothyroxine (SYNTHROID, LEVOTHROID) 175 MCG tablet Take 175 mcg by mouth daily before breakfast.       . losartan (COZAAR) 100 MG tablet Take 100 mg by mouth every morning.       . Metaxalone (SKELAXIN PO) Take by mouth.      . venlafaxine (EFFEXOR-XR) 150 MG 24 hr capsule Take 150 mg by mouth every morning.           Allergies  Allergen Reactions  . Tylenol (Acetaminophen) Nausea Only and Other (See Comments)    Passed out   Allergy  to Tylenol is a little unclear.  REVIEW OF SYSTEMS:  Cardiac:  Hypertension x 5 years.. No history of heart disease.   GYN:  History of hysterectomy. Urologic:  No history of kidney stones.  No history of bladder infections. Musculoskeletal: Knees and back ache, but she is not actively seeing a physician. Psycho-social:  Saw Dr. Hermelinda Dellen of [facility].  SOCIAL and FAMILY HISTORY: Married - husband is with her.  He just had a subtotal colectomy. 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  PHYSICAL EXAM: BP 110/82  Pulse 102  Temp 97.7 F (36.5 C) (Oral)  Resp 24  Ht 5' 1.5" (1.562 m)  Wt 257 lb (116.574 kg)  BMI 47.77 kg/m2  General: WN obese WF who is alert and generally healthy appearing.  HEENT: Normal. Pupils equal. Good dentition. Neck: Supple. No mass. Neck scar well healed. Carotid pulse okay with no bruit. Lungs: Clear to auscultation and symmetric breath sounds. Heart:  RRR. No murmur or rub.  Abdomen: Soft. No mass. No tenderness. No hernia. Normal bowel sounds. Scars from lap chole.  More pear than apple.  Scar in the lower abdomen from hysterectomy. Extremities:  Good strength and ROM  in upper and lower extremities. Neurologic:  Grossly intact to motor and sensory function. Psychiatric: Has normal mood and affect. Behavior is normal.   DATA REVIEWED: See HPI.  Ovidio Kin, MD,  [facility] [place] [address]  [address]   [place], [place]    [address] Phone:  [phone] FAX:  [phone]

## 2012-02-11 ENCOUNTER — Encounter (HOSPITAL_COMMUNITY): Payer: Self-pay | Admitting: Surgery

## 2012-02-11 ENCOUNTER — Inpatient Hospital Stay (HOSPITAL_COMMUNITY): Payer: Managed Care, Other (non HMO)

## 2012-02-11 DIAGNOSIS — Z09 Encounter for follow-up examination after completed treatment for conditions other than malignant neoplasm: Secondary | ICD-10-CM

## 2012-02-11 LAB — CBC WITH DIFFERENTIAL/PLATELET
Basophils Absolute: 0 10*3/uL (ref 0.0–0.1)
Basophils Relative: 0 % (ref 0–1)
HCT: 35.5 % — ABNORMAL LOW (ref 36.0–46.0)
Lymphocytes Relative: 11 % — ABNORMAL LOW (ref 12–46)
MCHC: 33.2 g/dL (ref 30.0–36.0)
Monocytes Absolute: 0.8 10*3/uL (ref 0.1–1.0)
Neutro Abs: 7.9 10*3/uL — ABNORMAL HIGH (ref 1.7–7.7)
Neutrophils Relative %: 81 % — ABNORMAL HIGH (ref 43–77)
Platelets: 328 10*3/uL (ref 150–400)
RDW: 13.8 % (ref 11.5–15.5)
WBC: 9.7 10*3/uL (ref 4.0–10.5)

## 2012-02-11 LAB — HEMOGLOBIN AND HEMATOCRIT, BLOOD
HCT: 34.9 % — ABNORMAL LOW (ref 36.0–46.0)
Hemoglobin: 11.4 g/dL — ABNORMAL LOW (ref 12.0–15.0)

## 2012-02-11 MED ORDER — PHENOL 1.4 % MT LIQD
1.0000 | OROMUCOSAL | Status: DC | PRN
  Administered 2012-02-11: 1 via OROMUCOSAL
  Filled 2012-02-11: qty 177

## 2012-02-11 NOTE — Plan of Care (Signed)
Problem: Phase II Progression Outcomes Goal: Discharge plan remains appropriate-arrangements made Outcome: Completed/Met Date Met:  02/11/12 Plans for discharge home on Thursday.

## 2012-02-11 NOTE — Progress Notes (Signed)
Pt received two doses of IV tylenol, tolerated well, no complaint of adverse reactions and non noted by Nurse.

## 2012-02-11 NOTE — Plan of Care (Signed)
Problem: Phase II Progression Outcomes Goal: Upper GI SWALLOW ACCEPTABLE Outcome: Completed/Met Date Met:  02/11/12 Patient upper gi passed.

## 2012-02-11 NOTE — Plan of Care (Signed)
Problem: Phase II Progression Outcomes Goal: Surgical site without signs of infection Outcome: Completed/Met Date Met:  02/11/12 Patient surgical sites clean and dry, with no redness, swelling or drainage.

## 2012-02-11 NOTE — Plan of Care (Signed)
Problem: Phase II Progression Outcomes Goal: Hemodynamically stable Outcome: Completed/Met Date Met:  02/11/12 Vitals stable

## 2012-02-11 NOTE — Progress Notes (Signed)
General Surgery Note  LOS: 1 day  POD# 1  Assessment/Plan: 1.  LAPAROSCOPIC ROUX-EN-Y GASTRIC, UPPER GI ENDOSCOPY - D. Armani Gawlik - 02/10/2012  Doing well.  Walked a lot last PM  For swallow and doppler's today.  2. Hypertension.  3. Questionable sleep apnea, but she would not wear the device, so there is no reason to test her.  4. Irritable bowel syndrome.  5. GERD.  6. Remote kidney stones.  7. 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  8. Thyroid cancer - 2008. Disease free. 9.  DVT  proph - SQ Heparin  Subjective:  Doing well.  Husband in room.  So far tolerated Tylenol without incident. Objective:   Filed Vitals:   02/11/12 0617  BP: 110/62  Pulse: 90  Temp: 97.6 F (36.4 C)  Resp: 18     Intake/Output from previous day:  08/26 0701 - 08/27 0700 In: 5935 [I.V.:5635; IV Piggyback:300] Out: 5110 [Urine:5060; Blood:50]  Intake/Output this shift:      Physical Exam:   General: WN obese WF who is alert and oriented.    HEENT: Normal. Pupils equal. .   Lungs: Clear    Abdomen: Soft, quiet.   Wound: look good.   Neurologic:  Grossly intact to motor and sensory function.   Psychiatric: Has normal mood and affect.   Lab Results:    Basename 02/11/12 0405 02/10/12 1641  WBC 9.7 --  HGB 11.8* 12.6  HCT 35.5* 37.5  PLT 328 --    BMET  No results found for this basename: NA:2,K:2,CL:2,CO2:2,GLUCOSE:2,BUN:2,CREATININE:2,CALCIUM:2 in the last 72 hours  PT/INR  No results found for this basename: LABPROT:2,INR:2 in the last 72 hours  ABG  No results found for this basename: PHART:2,PCO2:2,PO2:2,HCO3:2 in the last 72 hours   Studies/Results:  No results found.   Anti-infectives:   Anti-infectives     Start     Dose/Rate Route Frequency Ordered Stop   02/10/12 1600   cefOXitin (MEFOXIN) 1 g in dextrose 5 % 50 mL IVPB        1 g 100 mL/hr over 30 Minutes Intravenous 3 times per day 02/10/12 1457 02/10/12 2246   02/10/12 0553   cefOXitin (MEFOXIN) 2 g in  dextrose 5 % 50 mL IVPB        2 g 100 mL/hr over 30 Minutes Intravenous 60 min pre-op 02/10/12 0553 02/10/12 0735          Ovidio Kin, MD, FACS Pager: [phone],   [facility] [facility] Office: [phone] 02/11/2012

## 2012-02-11 NOTE — Plan of Care (Signed)
Problem: Phase II Progression Outcomes Goal: Pulmonary function stable Outcome: Progressing Patient using incentive spirometry appropriately.  Continues to drop sats when sleeping at times.

## 2012-02-11 NOTE — Progress Notes (Signed)
*  PRELIMINARY RESULTS* Vascular Ultrasound Lower extremity venous duplex has been completed.  Preliminary findings: bilaterally no evidence of DVT or baker's cyst.  Farrel Demark, RDMS, RVT  02/11/2012, 11:22 AM

## 2012-02-11 NOTE — Progress Notes (Signed)
Pt alert and oriented; husband at bedside; VSS; remains NPO for UGI; awaiting doppler study; denies any nausea or vomiting; denies flatus or BM at this time; voiding without difficulty; ambulating without difficulty; c/o some abdominal soreness with relief from prn meds; pt already has follow up appts with [facility] and [facility]; aware of support group and BELT program; discharge instructions given for pt to review; questions answered. GASTRIC BYPASS/SLEEVE DISCHARGE INSTRUCTIONS  Drs. Fredrik Rigger, [place], [place], and [place] Call if you have any problems.   Call [phone] and ask for the surgeon on call.    If you need immediate assistance come to the ER at [facility]. Tell the ER personnel that you are a new post-op gastric bypass patient. Signs and symptoms to report:   Severe vomiting or nausea. If you cannot tolerate clear liquids for longer than 1 day, you need to call your surgeon.    Abdominal pain which does not get better after taking your pain medication   Fever greater than 101 F degree   Difficulty breathing   Chest pain    Redness, swelling, drainage, or foul odor at incision sites    If your incisions open or pull apart   Swelling or pain in calf (lower leg)   Diarrhea, frequent watery, uncontrolled bowel movements.   Constipation, (no bowel movements for 3 days) if this occurs, Take Milk of Magnesia, 2 tablespoons by mouth, 3 times a day for 2 days if needed.  Call your doctor if constipation continues. Stop taking Milk of Magnesia once you have had a bowel movement. You may also use Miralax according to the label instructions.   Anything you consider "abnormal for you".   Normal side effects after Surgery:   Unable to sleep at night or concentrate   Irritability   Being tearful (crying) or depressed   These are common complaints, possibly related to your anesthesia, stress of surgery and change in lifestyle, that usually go away a few weeks after surgery.  If these feelings  continue, call your medical doctor.  Wound Care You may have surgical glue, steri-strips, or staples over your incisions after surgery.  Surgical glue:  Looks like a clear film over your incisions and will wear off gradually. Steri-strips: Strips of tape over your incisions. You may notice a yellowish color on the skin underneath the steri-strips. This is a substance used to make the steri-strips stick better. Do not pull the steri-strips off - let them fall off.  Staples: Cherlynn Polo may be removed before you leave the hospital. If you go home with staples, call [facility] [phone]) for an appointment with your surgeon's nurse to have staples removed in 7 - 10 days. Showering: You may shower two days after your surgery unless otherwise instructed by your surgeon. Wash gently around wounds with warm soapy water, rinse well, and gently pat dry.  If you have a drain, you may need someone to hold this while you shower. Avoid tub baths until staples are removed and incisions are healed.    Medications   Medications should be liquid or crushed if larger than the size of a dime.  Extended release pills should not be crushed.   Depending on the size and number of medications you take, you may need to stagger/change the time you take your medications so that you do not over-fill your pouch.    Make sure you follow-up with your primary care physician to make medication adjustments needed during rapid weight loss  and life-style adjustment.   If you are diabetic, follow up with the doctor that prescribes your diabetes medication(s) within one week after surgery and check your blood sugar regularly.   Do not drive while taking narcotics!   Do not take acetaminophen (Tylenol) and Roxicet or Lortab Elixir at the same time since these pain medications contain acetaminophen.  Diet at home: (First 2 Weeks) You will see the nutritionist two weeks after your surgery. She will advance your diet if you  are tolerating liquids well. Once at home, if you have severe vomiting or nausea and cannot tolerate clear liquids lasting longer than 1 day, call your surgeon.  Begin high protein shake 2 ounces every 3 hours, 5 - 6 times per day.  Gradually increase the amount you drink as tolerated.  You may find it easier to slowly sip shakes throughout the day.  It is important to get your proteins in first.   Protein Shake   Drink at least 2 ounces of shake 5-6 times per day   Each serving of protein shakes should have a minimum of 15 grams of protein and no more than 5 grams of carbohydrate    Increase the amount of protein shake you drink as tolerated   Protein powder may be added to fluids such as non-fat milk or Lactaid milk (limit to 20 grams added protein powder per serving   The initial goal is to drink at least 8 ounces of protein shake/drink per day (or as directed by the nutritionist). Some examples of protein shakes are [employer], [employer], EAS Edge HP, and [employer]jury. Hydration   Gradually increase the amount of water and other liquids as tolerated (See Acceptable Fluids)   Gradually increase the amount of protein shake as tolerated     Sip fluids slowly and throughout the day   May use Sugar substitutes, use sparingly (limit to 6 - 8 packets per day). Your fluid goal is 64 ounces of fluid daily. It may take a few weeks to build up to this.         32 oz (or more) should be clear liquids and 32 oz (or more) should be full liquids.         Liquids should not contain sugar, caffeine, or carbonation! Acceptable Fluids Clear Liquids:   Water or Sugar-free flavored water, Fruit H2O   Decaffeinated coffee or tea (sugar-free)   Crystal Lite, Wyler's Lite, Minute Maid Lite   Sugar-free Jell-O   Bouillon or broth   Sugar-free Popsicle:   *Less than 20 calories each; Limit 1 per day   Full Liquids:              Protein Shakes/Drinks + 2 choices per day of other full liquids shown below.     Other full liquids must be: No more than 12 grams of Carbs per serving,  No more than 3 grams of Fat per serving   Strained low-fat cream soup   Non-Fat milk   Fat-free Lactaid Milk   Sugar-free yogurt (Dannon Lite & Fit) Vitamins and Minerals (Start 1 day after surgery unless otherwise directed)   2 Chewable Multivitamin / Multimineral Supplement (i.e. Centrum for Adults)   Chewable Calcium Citrate with Vitamin D-3. Take 1500 mg each day.           (Example: 3 Chewable Calcium Plus 600 with Vitamin D-3 can be found at [facility])         Vitamin B-12, 350 - 500  micrograms (oral tablet) each day   Do not mix multivitamins containing iron with calcium supplements; take 2 hours   apart   Do not substitute Tums (calcium carbonate) for your calcium   Menstruating women and those at risk for anemia may need extra iron. Talk with your doctor to see if you need additional iron.    If you need extra iron:  Total daily Iron recommendations (including Vitamins) = 50 - 100 mg Iron/day Do not stop taking or change any vitamins or minerals until you talk to your nutritionist or surgeon. Your nutritionist and / or physician must approve all vitamin and mineral supplements. Exercise For maximum success, begin exercising as soon as your doctor recommends. Make sure your physician approves any physical activity.   Depending on fitness level, begin with a simple walking program   Walk 5-15 minutes each day, 7 days per week.    Slowly increase until you are walking 30-45 minutes per day   Consider joining our BELT program. [phone] or email [email or web address] .edu Things to remember:    You may have sexual relations when you feel comfortable. It is VERY important for female patients to use a reliable birth control method. Fertility often increases after surgery. Do not get pregnant for at least 18 months.   It is very important to keep all follow up appointments with your surgeon, nutritionist, primary care physician,  and behavioral health practitioner. After the first year, please follow up with your bariatric surgeon at least once a year in order to maintain best weight loss results.  [facility]: [phone] [facility]: [phone]   Free counseling is available for you and your family through collaboration between [facility] and [place]. Please call [phone] and leave a message.    Consider purchasing a medical alert bracelet that says you had gastric bypass surgery.    [facility] has a free Bariatric Surgery Support Group that meets monthly, the 3rd Thursday, 6 pm, Classroom #1, [place]. You may register online at [email or web address], but registration is not necessary. Select Classes and Support Groups, Bariatric Surgery, or Call [phone]   Do not return to work or drive until cleared by your surgeon   Use your CPAP when sleeping if applicable   Do not lift anything greater than ten pounds for at least two weeks  Talmadge Chad, RN Bariatric Nurse Coordinator

## 2012-02-11 NOTE — Plan of Care (Signed)
Problem: Phase II Progression Outcomes Goal: Doppler acceptable Outcome: Completed/Met Date Met:  02/11/12 Doppler negative for dvt

## 2012-02-11 NOTE — Care Management Note (Signed)
    Page 1 of 1   02/11/2012     3:19:14 PM   CARE MANAGEMENT NOTE 02/11/2012  Patient:  Joanna Meyer, Joanna Meyer   Account Number:  [id number]  Date Initiated:  02/11/2012  Documentation initiated by:  Lorenda Ishihara  Subjective/Objective Assessment:   55 yo female admitted s/p gastric bypass. PTA lived at home with spouse     Action/Plan:   Anticipated DC Date:  02/13/2012   Anticipated DC Plan:  HOME/SELF CARE      DC Planning Services  CM consult      Choice offered to / List presented to:             Status of service:  Completed, signed off Medicare Important Message given?   (If response is "NO", the following Medicare IM given date fields will be blank) Date Medicare IM given:   Date Additional Medicare IM given:    Discharge Disposition:  HOME/SELF CARE  Per UR Regulation:  Reviewed for med. necessity/level of care/duration of stay  If discussed at Long Length of Stay Meetings, dates discussed:    Comments:

## 2012-02-11 NOTE — Plan of Care (Signed)
Problem: Phase II Progression Outcomes Goal: Activity at appropriate level-compared to baseline (UP IN CHAIR FOR HEMODIALYSIS)  Outcome: Completed/Met Date Met:  02/11/12 Patient tolerating walking in halls without difficulty.

## 2012-02-11 NOTE — Plan of Care (Signed)
Problem: Phase II Progression Outcomes Goal: Tolerating diet advance to Outcome: Completed/Met Date Met:  02/11/12 Patient advanced to 2oz water every 4 hours.  Tolerating without nausea or vomiting.

## 2012-02-11 NOTE — Progress Notes (Signed)
UGI results called to Dr. Ezzard Standing; orders received to begin POD #1 diet; Michel Harrow, RN notified of results and orders. Talmadge Chad, RN Bariatric Nurse Coordinator

## 2012-02-12 LAB — CBC WITH DIFFERENTIAL/PLATELET
Basophils Absolute: 0 10*3/uL (ref 0.0–0.1)
Eosinophils Absolute: 0 10*3/uL (ref 0.0–0.7)
Eosinophils Relative: 0 % (ref 0–5)
Lymphocytes Relative: 32 % (ref 12–46)
Lymphs Abs: 2.8 10*3/uL (ref 0.7–4.0)
MCV: 85.8 fL (ref 78.0–100.0)
Neutrophils Relative %: 58 % (ref 43–77)
Platelets: 313 10*3/uL (ref 150–400)
RBC: 4.08 MIL/uL (ref 3.87–5.11)
RDW: 13.9 % (ref 11.5–15.5)
WBC: 8.8 10*3/uL (ref 4.0–10.5)

## 2012-02-12 MED ORDER — AMLODIPINE BESYLATE 10 MG PO TABS
10.0000 mg | ORAL_TABLET | Freq: Every morning | ORAL | Status: DC
  Filled 2012-02-12: qty 1

## 2012-02-12 MED ORDER — LEVOTHYROXINE SODIUM 175 MCG PO TABS
175.0000 ug | ORAL_TABLET | Freq: Every day | ORAL | Status: DC
  Filled 2012-02-12 (×2): qty 1

## 2012-02-12 NOTE — Progress Notes (Signed)
Pt alert and oriented; husband at bedside; pt sitting up in chair; VSS; tolerating water well; will advance to protein shakes to day; denies any nausea or vomiting; denies flatus or BM at this time; voiding without difficulty; ambulating in hallways without difficulty; using incentive spirometer; continue current plan of care; plan for discharge tomorrow. Talmadge Chad, RN Bariatric Nurse coordinator

## 2012-02-12 NOTE — Progress Notes (Addendum)
General Surgery Note  LOS: 2 days  POD# 2  Assessment/Plan: 1.  LAPAROSCOPIC ROUX-EN-Y GASTRIC, UPPER GI ENDOSCOPY - D. Roark Rufo - 02/10/2012  Dopplers and UGI okay.  Tolerated water, to increase to protein drinks.  Because she is from [place] area, will keep one more day to observe, then probably discharge tomorrow.  2. Hypertension.  3. Questionable sleep apnea, but she would not wear the device, so there is no reason to test her.   Her sats have run low in the hospital, probably confirming OSA.  She and her husband have a better understanding of what is going on. 4. Irritable bowel syndrome.  5. GERD.  6. Remote kidney stones.  7. 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  8. Thyroid cancer - 2008. Disease free.  On thyroid replacement.  To restart tomorrow. 9.  DVT  proph - SQ Heparin  Subjective:  Doing well.  Husband in room.  Tolerated water.  Feels just a little tight. Objective:   Filed Vitals:   02/12/12 0559  BP: 142/80  Pulse: 85  Temp: 98.3 F (36.8 C)  Resp: 18     Intake/Output from previous day:  08/27 0701 - 08/28 0700 In: 3877.5 [P.O.:300; I.V.:3577.5] Out: 2900 [Urine:2900]  Intake/Output this shift:      Physical Exam:   General: WN obese WF who is alert and oriented.    HEENT: Normal. Pupils equal. .   Lungs: Clear    Abdomen: Soft, rare BS.   Wound: Look good.   Neurologic:  Grossly intact to motor and sensory function.   Psychiatric: Has normal mood and affect.   Lab Results:     Basename 02/12/12 0418 02/11/12 1535 02/11/12 0405  WBC 8.8 -- 9.7  HGB 11.4* 11.4* --  HCT 35.0* 34.9* --  PLT 313 -- 328    BMET  No results found for this basename: NA:2,K:2,CL:2,CO2:2,GLUCOSE:2,BUN:2,CREATININE:2,CALCIUM:2 in the last 72 hours  PT/INR  No results found for this basename: LABPROT:2,INR:2 in the last 72 hours  ABG  No results found for this basename: PHART:2,PCO2:2,PO2:2,HCO3:2 in the last 72 hours   Studies/Results:  Dg Ugi  W/water Sol Cm  02/11/2012  *RADIOLOGY REPORT*  Clinical Data: Postoperative for gastric bypass surgery.  ESOPHAGUS/BARIUM SWALLOW/TABLET STUDY  Technique: The patient was administered 50 ml of oral Omnipaque- 300.  Fluoro time 1 minute.  Comparison:  10/04/2011  Findings: Gastric pouch noted with patent gastrojejunostomy. Contrast in the vicinity appears all be within the stomach and jejunum, without leak observed.  IMPRESSION:  1.  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   Original Report Authenticated By: Dellia Cloud, M.D.      Anti-infectives:   Anti-infectives     Start     Dose/Rate Route Frequency Ordered Stop   02/10/12 1600   cefOXitin (MEFOXIN) 1 g in dextrose 5 % 50 mL IVPB        1 g 100 mL/hr over 30 Minutes Intravenous 3 times per day 02/10/12 1457 02/10/12 2246   02/10/12 0553   cefOXitin (MEFOXIN) 2 g in dextrose 5 % 50 mL IVPB        2 g 100 mL/hr over 30 Minutes Intravenous 60 min pre-op 02/10/12 0553 02/10/12 0735          Ovidio Kin, MD, FACS Pager: [phone],   [facility] Office: [phone] 02/12/2012

## 2012-02-13 MED ORDER — OXYCODONE-ACETAMINOPHEN 5-325 MG/5ML PO SOLN: 5.0000 mL | ORAL | Status: DC | PRN

## 2012-02-13 NOTE — Progress Notes (Signed)
General Surgery Note  LOS: 3 days  POD# 3  Assessment/Plan: 1.  LAPAROSCOPIC ROUX-EN-Y GASTRIC, UPPER GI ENDOSCOPY - D. Brionne Mertz - 02/10/2012  Has done well.  Ready to go home.  Roxicet causes some itching.  D/C instructions reviewed. 2. Hypertension.  3. Questionable sleep apnea, but she would not wear the device, so there is no reason to test her.   Her sats have run low in the hospital, probably confirming OSA.  She and her husband have a better understanding of what is going on. 4. Irritable bowel syndrome.  5. GERD.  6. Remote kidney stones.  7. 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  8. Thyroid cancer - 2008. Disease free.  On thyroid replacement.  To restart tomorrow. 9.  DVT  proph - SQ Heparin  Subjective:  Doing well.  Husband in room.  Ready for D/C. Objective:   Filed Vitals:   02/13/12 0610  BP: 124/80  Pulse: 76  Temp: 97.7 F (36.5 C)  Resp: 18     Intake/Output from previous day:  08/28 0701 - 08/29 0700 In: 2216.3 [P.O.:180; I.V.:2036.3] Out: 5900 [Urine:5900]  Intake/Output this shift:      Physical Exam:   General: WN obese WF who is alert and oriented.    HEENT: Normal. Pupils equal. .   Lungs: Clear    Abdomen: Soft, rare BS.   Wound: Look good.   Neurologic:  Grossly intact to motor and sensory function.   Psychiatric: Has normal mood and affect.   Lab Results:     Basename 02/12/12 0418 02/11/12 1535 02/11/12 0405  WBC 8.8 -- 9.7  HGB 11.4* 11.4* --  HCT 35.0* 34.9* --  PLT 313 -- 328    BMET  No results found for this basename: NA:2,K:2,CL:2,CO2:2,GLUCOSE:2,BUN:2,CREATININE:2,CALCIUM:2 in the last 72 hours  PT/INR  No results found for this basename: LABPROT:2,INR:2 in the last 72 hours  ABG  No results found for this basename: PHART:2,PCO2:2,PO2:2,HCO3:2 in the last 72 hours   Studies/Results:  Dg Ugi W/water Sol Cm  02/11/2012  *RADIOLOGY REPORT*  Clinical Data: Postoperative for gastric bypass surgery.   ESOPHAGUS/BARIUM SWALLOW/TABLET STUDY  Technique: The patient was administered 50 ml of oral Omnipaque- 300.  Fluoro time 1 minute.  Comparison:  10/04/2011  Findings: Gastric pouch noted with patent gastrojejunostomy. Contrast in the vicinity appears all be within the stomach and jejunum, without leak observed.  IMPRESSION:  1.  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   Original Report Authenticated By: Dellia Cloud, M.D.      Anti-infectives:   Anti-infectives     Start     Dose/Rate Route Frequency Ordered Stop   02/10/12 1600   cefOXitin (MEFOXIN) 1 g in dextrose 5 % 50 mL IVPB        1 g 100 mL/hr over 30 Minutes Intravenous 3 times per day 02/10/12 1457 02/10/12 2246   02/10/12 0553   cefOXitin (MEFOXIN) 2 g in dextrose 5 % 50 mL IVPB        2 g 100 mL/hr over 30 Minutes Intravenous 60 min pre-op 02/10/12 0553 02/10/12 0735          Ovidio Kin, MD, FACS Pager: [phone],   [facility] Office: [phone] 02/13/2012

## 2012-02-13 NOTE — Progress Notes (Signed)
Pt alert and oriented; VSS; ready for discharge today; tolerating protein shakes and water well; denies any nausea or vomiting; +flatus; +BM; voiding without difficulty; ambulating without difficulty; c/o some abdominal discomfort with relief from prn meds; using incentive spirometer; has follow up appts with [facility] and [facility]; aware of support group and BELT program; discharge instructions reviewed and pt verbalized understanding of. GASTRIC BYPASS/SLEEVE DISCHARGE INSTRUCTIONS  Drs. Fredrik Rigger, [place], [place], and [place] Call if you have any problems.   Call [phone] and ask for the surgeon on call.    If you need immediate assistance come to the ER at [facility]. Tell the ER personnel that you are a new post-op gastric bypass patient. Signs and symptoms to report:   Severe vomiting or nausea. If you cannot tolerate clear liquids for longer than 1 day, you need to call your surgeon.    Abdominal pain which does not get better after taking your pain medication   Fever greater than 101 F degree   Difficulty breathing   Chest pain    Redness, swelling, drainage, or foul odor at incision sites    If your incisions open or pull apart   Swelling or pain in calf (lower leg)   Diarrhea, frequent watery, uncontrolled bowel movements.   Constipation, (no bowel movements for 3 days) if this occurs, Take Milk of Magnesia, 2 tablespoons by mouth, 3 times a day for 2 days if needed.  Call your doctor if constipation continues. Stop taking Milk of Magnesia once you have had a bowel movement. You may also use Miralax according to the label instructions.   Anything you consider "abnormal for you".   Normal side effects after Surgery:   Unable to sleep at night or concentrate   Irritability   Being tearful (crying) or depressed   These are common complaints, possibly related to your anesthesia, stress of surgery and change in lifestyle, that usually go away a few weeks after surgery.  If these  feelings continue, call your medical doctor.  Wound Care You may have surgical glue, steri-strips, or staples over your incisions after surgery.  Surgical glue:  Looks like a clear film over your incisions and will wear off gradually. Steri-strips: Strips of tape over your incisions. You may notice a yellowish color on the skin underneath the steri-strips. This is a substance used to make the steri-strips stick better. Do not pull the steri-strips off - let them fall off.  Staples: Cherlynn Polo may be removed before you leave the hospital. If you go home with staples, call [facility] [phone]) for an appointment with your surgeon's nurse to have staples removed in 7 - 10 days. Showering: You may shower two days after your surgery unless otherwise instructed by your surgeon. Wash gently around wounds with warm soapy water, rinse well, and gently pat dry.  If you have a drain, you may need someone to hold this while you shower. Avoid tub baths until staples are removed and incisions are healed.    Medications   Medications should be liquid or crushed if larger than the size of a dime.  Extended release pills should not be crushed.   Depending on the size and number of medications you take, you may need to stagger/change the time you take your medications so that you do not over-fill your pouch.    Make sure you follow-up with your primary care physician to make medication adjustments needed during rapid weight loss and life-style adjustment.  If you are diabetic, follow up with the doctor that prescribes your diabetes medication(s) within one week after surgery and check your blood sugar regularly.   Do not drive while taking narcotics!   Do not take acetaminophen (Tylenol) and Roxicet or Lortab Elixir at the same time since these pain medications contain acetaminophen.  Diet at home: (First 2 Weeks) You will see the nutritionist two weeks after your surgery. She will advance your  diet if you are tolerating liquids well. Once at home, if you have severe vomiting or nausea and cannot tolerate clear liquids lasting longer than 1 day, call your surgeon.  Begin high protein shake 2 ounces every 3 hours, 5 - 6 times per day.  Gradually increase the amount you drink as tolerated.  You may find it easier to slowly sip shakes throughout the day.  It is important to get your proteins in first.   Protein Shake   Drink at least 2 ounces of shake 5-6 times per day   Each serving of protein shakes should have a minimum of 15 grams of protein and no more than 5 grams of carbohydrate    Increase the amount of protein shake you drink as tolerated   Protein powder may be added to fluids such as non-fat milk or Lactaid milk (limit to 20 grams added protein powder per serving   The initial goal is to drink at least 8 ounces of protein shake/drink per day (or as directed by the nutritionist). Some examples of protein shakes are [employer], Dillard's, EAS Edge HP, and Unjury. Hydration   Gradually increase the amount of water and other liquids as tolerated (See Acceptable Fluids)   Gradually increase the amount of protein shake as tolerated     Sip fluids slowly and throughout the day   May use Sugar substitutes, use sparingly (limit to 6 - 8 packets per day). Your fluid goal is 64 ounces of fluid daily. It may take a few weeks to build up to this.         32 oz (or more) should be clear liquids and 32 oz (or more) should be full liquids.         Liquids should not contain sugar, caffeine, or carbonation! Acceptable Fluids Clear Liquids:   Water or Sugar-free flavored water, Fruit H2O   Decaffeinated coffee or tea (sugar-free)   Crystal Lite, Wyler's Lite, Minute Maid Lite   Sugar-free Jell-O   Bouillon or broth   Sugar-free Popsicle:   *Less than 20 calories each; Limit 1 per day   Full Liquids:              Protein Shakes/Drinks + 2 choices per day of other full liquids  shown below.    Other full liquids must be: No more than 12 grams of Carbs per serving,  No more than 3 grams of Fat per serving   Strained low-fat cream soup   Non-Fat milk   Fat-free Lactaid Milk   Sugar-free yogurt (Dannon Lite & Fit) Vitamins and Minerals (Start 1 day after surgery unless otherwise directed)   2 Chewable Multivitamin / Multimineral Supplement (i.e. Centrum for Adults)   Chewable Calcium Citrate with Vitamin D-3. Take 1500 mg each day.           (Example: 3 Chewable Calcium Plus 600 with Vitamin D-3 can be found at [facility])         Vitamin B-12, 350 - 500 micrograms (oral tablet) each day  Do not mix multivitamins containing iron with calcium supplements; take 2 hours   apart   Do not substitute Tums (calcium carbonate) for your calcium   Menstruating women and those at risk for anemia may need extra iron. Talk with your doctor to see if you need additional iron.    If you need extra iron:  Total daily Iron recommendations (including Vitamins) = 50 - 100 mg Iron/day Do not stop taking or change any vitamins or minerals until you talk to your nutritionist or surgeon. Your nutritionist and / or physician must approve all vitamin and mineral supplements. Exercise For maximum success, begin exercising as soon as your doctor recommends. Make sure your physician approves any physical activity.   Depending on fitness level, begin with a simple walking program   Walk 5-15 minutes each day, 7 days per week.    Slowly increase until you are walking 30-45 minutes per day   Consider joining our BELT program. [phone] or email belt@[employer] .edu Things to remember:    You may have sexual relations when you feel comfortable. It is VERY important for female patients to use a reliable birth control method. Fertility often increases after surgery. Do not get pregnant for at least 18 months.   It is very important to keep all follow up appointments with your surgeon, nutritionist, primary  care physician, and behavioral health practitioner. After the first year, please follow up with your bariatric surgeon at least once a year in order to maintain best weight loss results.  [facility]: [phone] [facility]: [phone]   Free counseling is available for you and your family through collaboration between [facility] and [place]. Please call [phone] and leave a message.    Consider purchasing a medical alert bracelet that says you had gastric bypass surgery.    The [facility] has a free Bariatric Surgery Support Group that meets monthly, the 3rd Thursday, 6 pm, Classroom #1, [employer]. You may register online at [email or web address], but registration is not necessary. Select Classes and Support Groups, Bariatric Surgery, or Call [phone]   Do not return to work or drive until cleared by your surgeon   Use your CPAP when sleeping if applicable   Do not lift anything greater than ten pounds for at least two weeks  Talmadge Chad, RN Bariatric Nurse Coordinator

## 2012-02-14 NOTE — Discharge Summary (Signed)
Joanna Meyer, Joanna Meyer              ACCOUNT NO.:  [id number]  MEDICAL RECORD NO.:  [id number]  LOCATION:  1535                         FACILITY:  [facility]  PHYSICIAN:  Sandria Bales. Ezzard Standing, M.D.  DATE OF BIRTH:  12-25-1955  DATE OF ADMISSION:  02/10/2012 DATE OF DISCHARGE:  02/13/2012                              DISCHARGE SUMMARY  DIAGNOSES: 1. Morbid obesity.  Weight 257, BMI of 47.8. 2. Hypertension. 3. Sleep apnea, but does not wear the CPAP device because of being     uncomfortable. 4. Irritable bowel syndrome. 5. Gastroesophageal reflux disease. 6. Remote history of kidney stones. 7. 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 8. A thyroid cancer disease free.  OPERATION PERFORMED:  The patient underwent a laparoscopic Roux-en-Y gastric bypass by Dr. Ovidio Kin on February 10, 2012.  HISTORY:  This is a 56 year old white female, who sees Dr. Morrell Riddle Vurlicer who is her primary care doctor in [place], [place]..  She has been through our preop bariatric program.  Her son had a lap band performed in [place] and he has lost over 100 pounds.  She has some insight into bariatric surgery and weight loss.  She decided to proceed with a Roux-en-Y gastric bypass.  She completed our preoperative evaluation and saw Dr. Barnett Abu from Psychiatry in [place].  HOSPITAL COURSE:  She was taken to the operating room on the day of admission where she underwent a laparoscopic Roux-en-Y gastric bypass and upper endoscopy by Dr. Ovidio Kin, this was done on February 10, 2012.  Postoperatively, she did well on her first postoperative day, her hemoglobin was 11.8, her white blood count of 9700.  She had an upper GI which showed no evidence of leak.  She had Dopplers of her lower extremities which showed no DVT.  She was advanced on water diet and then she started on protein diet on the second postoperative day.  She is now 3 days postop.  She is tolerating her diet well.  She has had no nausea or  vomiting.  No fever.  She is ready for discharge.  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  She should not drive for 2-3 days but can drive if she is doing well at that time.   She should do no lifting over 15 pounds for about a week but no limits after that.  For wound care, she may shower.  She has Dermabond on her skin.  She know just to wash with soap and water.  Her diet is a postop bariatric diet with instructions being given.  She has appointment to see me back in about 2-3 weeks and see the nutritionist on the same day.  She is given Roxicet for pain.  She has had some itching with this.  She is using Benadryl.  We talked about switching to another medicine but right now we decided to stick with the Roxicet.  DISCHARGE CONDITION:  Good.  Sandria Bales. Ezzard Standing, M.D., FACS   DHN/MEDQ  D:  02/13/2012  T:  02/14/2012  Job:  [id number]  cc:   Dr. Morrell Riddle Vurlicer

## 2012-02-18 ENCOUNTER — Telehealth (INDEPENDENT_AMBULATORY_CARE_PROVIDER_SITE_OTHER): Payer: Self-pay

## 2012-02-18 NOTE — Telephone Encounter (Signed)
Pt called wanting to know if she could take OTC liquid antiacid for occasional indigestion. Pt advised not to take this at same time she takes her prescription meds. Pt also advised if indigestion does not resolve with OTC antiacids she needs to contact our office for review with MD.

## 2012-02-19 ENCOUNTER — Telehealth: Payer: Self-pay | Admitting: *Deleted

## 2012-02-19 NOTE — Telephone Encounter (Signed)
See above

## 2012-02-21 ENCOUNTER — Telehealth: Payer: Self-pay | Admitting: *Deleted

## 2012-02-21 ENCOUNTER — Encounter: Payer: Self-pay | Admitting: *Deleted

## 2012-02-21 NOTE — Progress Notes (Signed)
Pt emailed for help regarding food ideas as she cannot tolerate the protein shakes. Has been adding fruit to them per recommendation by a nurse from her insurance company who called to check on her. Advised pt against fruit and advised to try Unjury Chicken Soup or unflavored version. Also advised to add a high protein powder or liquid protein to her shakes (ex: Premier Pro, Pure Pro) to increase her protein intake and to try lactose free milk, as she may be moving towards post-op lactose intolerance.   Pt also asked if she could take her medications (levothyroxine, losartan, amlodipine, and effexor) with her MVI and calcium citrate. Advised pt to call her MD as information found stated that calcium could have interaction with amlodipine and decrease effectiveness of levothyroxine.   Instructed pt to email or call with any further questions.

## 2012-02-21 NOTE — Telephone Encounter (Signed)
Try #2 to return call to patient re: meds. LVM

## 2012-02-26 ENCOUNTER — Encounter: Payer: Managed Care, Other (non HMO) | Attending: Surgery | Admitting: *Deleted

## 2012-02-26 ENCOUNTER — Encounter (INDEPENDENT_AMBULATORY_CARE_PROVIDER_SITE_OTHER): Payer: Self-pay | Admitting: Surgery

## 2012-02-26 ENCOUNTER — Ambulatory Visit (INDEPENDENT_AMBULATORY_CARE_PROVIDER_SITE_OTHER): Payer: Managed Care, Other (non HMO) | Admitting: Surgery

## 2012-02-26 ENCOUNTER — Encounter: Payer: Self-pay | Admitting: *Deleted

## 2012-02-26 DIAGNOSIS — Z713 Dietary counseling and surveillance: Secondary | ICD-10-CM | POA: Insufficient documentation

## 2012-02-26 DIAGNOSIS — Z9884 Bariatric surgery status: Secondary | ICD-10-CM

## 2012-02-26 DIAGNOSIS — Z09 Encounter for follow-up examination after completed treatment for conditions other than malignant neoplasm: Secondary | ICD-10-CM | POA: Insufficient documentation

## 2012-02-26 NOTE — Patient Instructions (Addendum)
Patient to follow Phase 3A-Soft, High Protein Diet and follow-up at [facility] in 6 weeks for 2 months post-op nutrition visit for diet advancement. 

## 2012-02-26 NOTE — Progress Notes (Signed)
Bariatric Follow-Up:  Appt start time: 1230 end time:  1330.  2 Week Post-Operative Nutrition Follow-Up  Patient was seen on 02/26/2012 for Post-Operative Nutrition [facility].   Surgery date: 02/10/12 Surgery type: RYGB Start weight at [facility]:  251.5 lbs (11/13/11)  Weight today: 234.0 lbs Weight change: 17.5 lbs Total weight lost: 17.5 lbs BMI: 44.2 kg/m^2  TANITA  BODY COMP RESULTS  11/13/11 02/26/12   Fat Mass (lbs) 129.0 119.0   Fat Free Mass (lbs) 122.5 115.0   Total Body Water (lbs) 89.5 84.0   The following the learning objectives were met by the patient during this visit:   Identifies Phase 3A (Soft, High Proteins) Dietary Goals and will begin from 2 weeks post-operatively to 2 months post-operatively  Identifies appropriate sources of fluids and proteins   States protein recommendations and appropriate sources post-operatively  Identifies the need for appropriate texture modifications, mastication, and bite sizes when consuming solids  Identifies appropriate multivitamin and calcium sources post-operatively  Describes the need for physical activity post-operatively and will follow MD recommendations  States when to call healthcare provider regarding medication questions or post-operative complications  Handouts given during visit include:  Phase 3A: Soft, High Protein Diet Handout  Follow-Up Plan: Patient will follow-up at [facility] in 6 weeks for 2 months post-op nutrition visit for diet advancement per MD.

## 2012-02-26 NOTE — Progress Notes (Signed)
Re:   Joanna Meyer DOB:   1956-03-07 MRN:   [id number]  ASSESSMENT AND PLAN: 1.  Morbid obesity, Wt - 257, BMI - 47.8  RYGB - 02/10/2012.  She is down about 20 pounds from her pre op weight.  She said that she is hungry and sees the nutritionist later today.  Return appt in 3 months.  She will get her labs drawn in [place].  2.  Hypertension. 2.  Questionable sleep apnea, but she would not wear the device, so there is no reason to test her. 4.  Irritable bowel syndrome. 5.  GERD. 6.  Remote kidney stones. 7.  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 8.  Thyroid cancer - 2008.  Disease free. 9.  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  Chief Complaint  Patient presents with  . Bariatric Follow Up    postop   REFERRING PHYSICIAN: Dr. Remus Blake Vurlicer, [place], [place]. [phone]: [phone])  HISTORY OF PRESENT ILLNESS: Joanna Meyer is a 56 y.o. (DOB: 06-17-1956)  white female whose primary care physician is Dr. Remus Blake Vurlicer, [place] and comes to me follow up of RYGB.   For first followup from surgery.  Comes with her husband.  She said that she is hungry and sees the nutritionist later today.  No other complaint or concern.  Bariatric History: She has been to our information session in her Dr. Andrey Campanile speak. Her son had a LAP-BAND performed in [place] has lost over 100 pounds.   She decided on the RYGB, in part, because of her discussion with the psychologist.    Psych - Barnett Abu - supported  (he is in [place] and had a lap band!)   Past Medical History  Diagnosis Date  . Thyroid disease   . IBS (irritable bowel syndrome)   . Hypertension   . Hyperlipidemia   . GERD (gastroesophageal reflux disease)   . Morbid obesity   . Hypothyroidism   . Anxiety   . Depression   . Cancer 2009 and 25 yrs ago    thyroid, radiation  tx 2009  . Kidney stones     hx of  . PONV (postoperative nausea and vomiting)       nausea   . Sleep apnea     stopbang =6     Current Outpatient Prescriptions  Medication Sig Dispense Refill  . amLODipine (NORVASC) 10 MG tablet Take 10 mg by mouth every morning.       . diazepam (VALIUM) 5 MG tablet Take 5 mg by mouth every 6 (six) hours as needed.      Marland Kitchen levothyroxine (SYNTHROID, LEVOTHROID) 175 MCG tablet Take 175 mcg by mouth daily before breakfast.       . losartan (COZAAR) 100 MG tablet Take 100 mg by mouth every evening.       . venlafaxine (EFFEXOR-XR) 150 MG 24 hr capsule Take 150 mg by mouth every morning.         Allergies  Allergen Reactions  . Tylenol (Acetaminophen) Nausea Only and Other (See Comments)    Passed out   Allergy to Tylenol is a little unclear.  REVIEW OF SYSTEMS:  Cardiac:  Hypertension x 5 years.. No history of heart disease.   GYN:  History of hysterectomy. GI:  She does have some GERD, but no gastric ulcer.  History of cholecystectomy in the 1990's.   She does have  irritable bowel syndrome. She had an upper endo and a colonoscopy about 3 years ago.  She has had an abdominal hysterectomy. Musculoskeletal: Knees and back ache, but she is not actively seeing a physician. Psycho-social:  Saw Dr. Hermelinda Dellen of [facility].  SOCIAL and FAMILY HISTORY: Married - husband is with her.  He just had a subtotal colectomy. She works as sec for ER physician. She has 3 children - 33, 31, and 25.  PHYSICAL EXAM: BP 120/82  Pulse 66  Temp 97.1 F (36.2 C) (Oral)  Resp 18  Ht 5' 1.5" (1.562 m)  Wt 233 lb 6 oz (105.858 kg)  BMI 43.38 kg/m2  General: WN obese WF who is alert and generally healthy appearing.  Lungs: Clear to auscultation and symmetric breath sounds. Heart:  RRR. No murmur or rub. Abdomen: Soft. No mass. Incisions look good.  BS present.  No pain.  DATA REVIEWED: None new.  Ovidio Kin, MD,  [facility] [address] [place]  [address]   [place]    [address] Phone:   [phone] FAX:  [phone]

## 2012-04-08 ENCOUNTER — Ambulatory Visit: Payer: Managed Care, Other (non HMO) | Admitting: *Deleted

## 2012-04-28 ENCOUNTER — Telehealth (INDEPENDENT_AMBULATORY_CARE_PROVIDER_SITE_OTHER): Payer: Self-pay | Admitting: General Surgery

## 2012-04-28 NOTE — Telephone Encounter (Signed)
Pt called to ask if having headaches was associated with bariatric surgery.  She has been having severe headaches recently; has appt with PCP today, but was asked to call her surgeon to ask.  Explained there are many reasons for headaches, but those that might relate to RNY surgery (done 02/10/12) would be (1) weaning off caffiene, (2) too little protein intake and/or (3) not enough fluids-dehydration.  She has a list of labs she needs for her next appt in December to follow-up and will ask the PCP to order them now and FAX results to Dr. Ezzard Standing.  Pt states she will look closely at her diet to make sure her protein intake is appropriate and will try to increase her overall fluid intake.

## 2012-04-29 ENCOUNTER — Telehealth (INDEPENDENT_AMBULATORY_CARE_PROVIDER_SITE_OTHER): Payer: Self-pay

## 2012-04-29 NOTE — Telephone Encounter (Signed)
Voice mail / Courtesy call to ask if pt is feeling better if not to please call

## 2013-10-05 ENCOUNTER — Telehealth (HOSPITAL_COMMUNITY): Payer: Self-pay

## 2013-10-05 NOTE — Telephone Encounter (Signed)
This patient is overdue for recommended follow-up with a bariatric surgeon at [facility]. Call attempted today to reestablish post-op care with [facility], but unable to reach patient by phone.  A letter will be mailed to the patient today to the address on file from [facility] advising the patient on the benefits of follow-up care and directing them to call [facility] at [phone] to schedule an appointment at their earliest convenience.  ° °Amanda T. Fleming °Bariatric Office Coordinator °[phone] ° °

## 2013-10-27 IMAGING — RF DG UGI W/ KUB
15 of 19 series · 15 of 19 positions shown · non-contrast
Comparison: None.

CLINICAL DATA: Bariatric screening evaluation.  History of reflux

UPPER GI SERIES WITH KUB
TECHNIQUE: Routine upper GI series was performed with thin barium
Fluoroscopy Time: 2.0 minutes

[Series 1: run · 1 of 1 slices shown (1 of 9)]
[im 1/1]
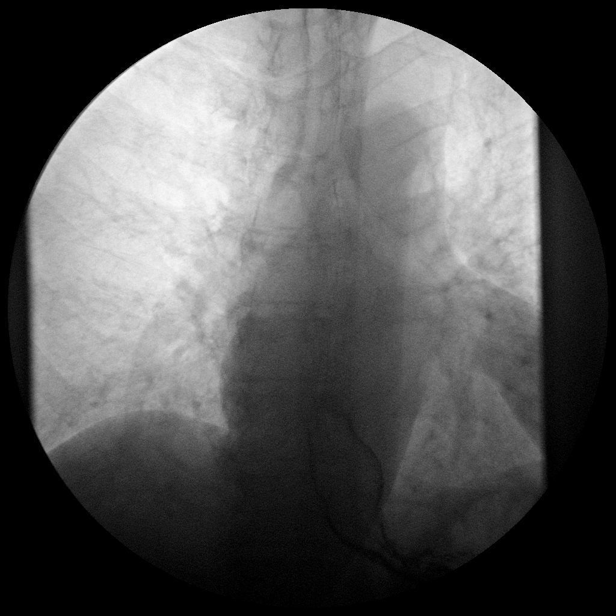

[Series 2: run · 1 of 1 slices shown (2 of 9)]
[im 1/1]
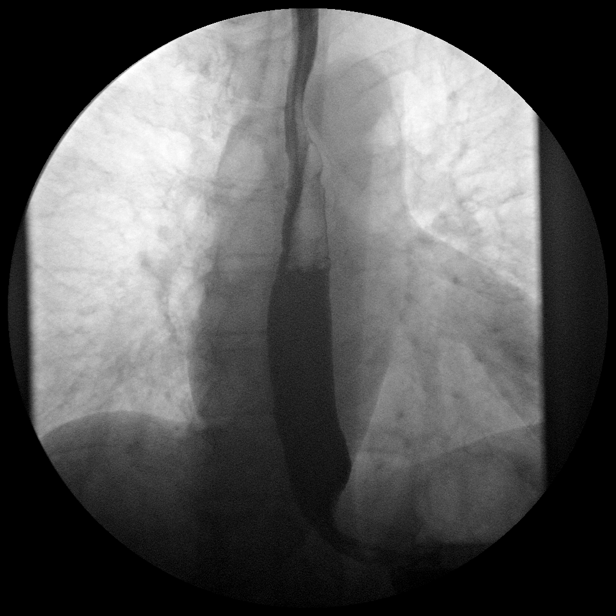

[Series 4: run · 1 of 1 slices shown (3 of 9)]
[im 1/1]
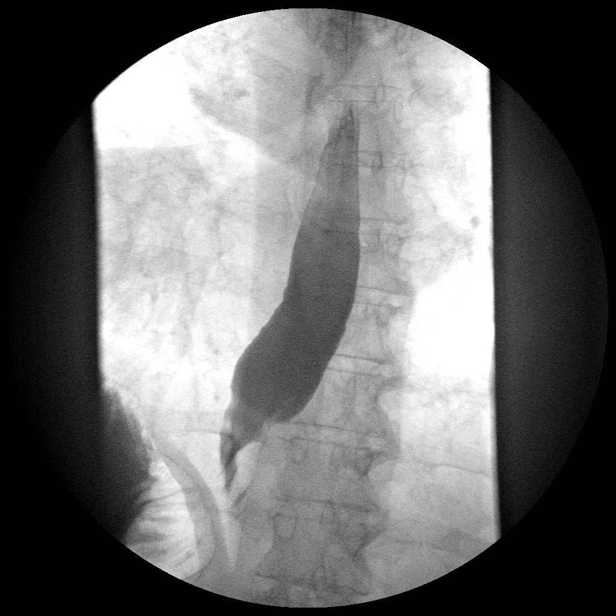

[Series 5: run · 1 of 1 slices shown (4 of 9)]
[im 1/1]
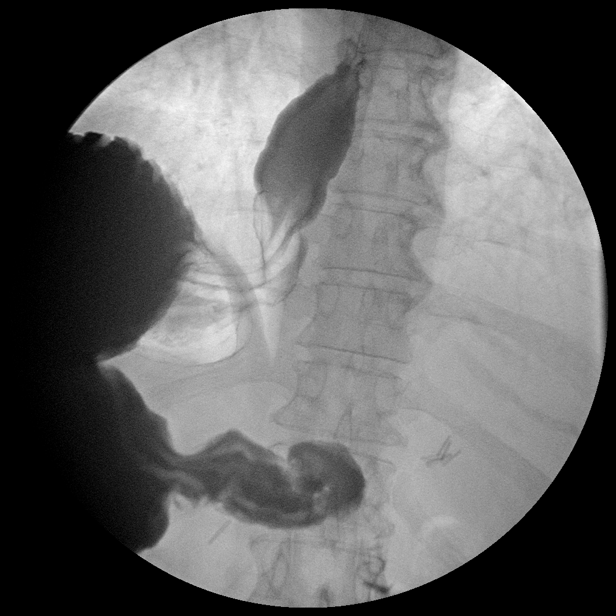

[Series 6: run · 1 of 1 slices shown (5 of 9)]
[im 1/1]
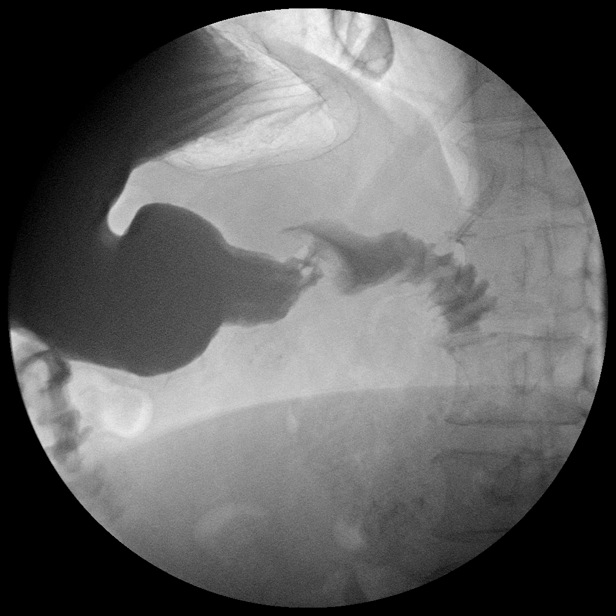

[Series 7: run · 1 of 1 slices shown (6 of 9)]
[im 1/1]
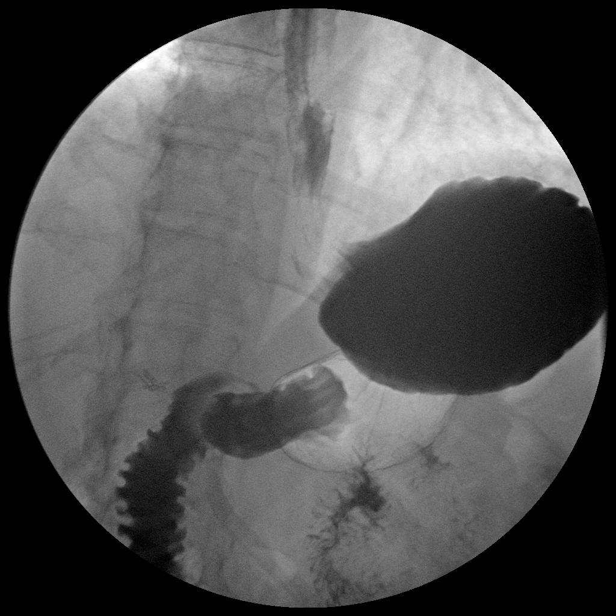

[Series 9: run · 1 of 1 slices shown (7 of 9)]
[im 1/1]
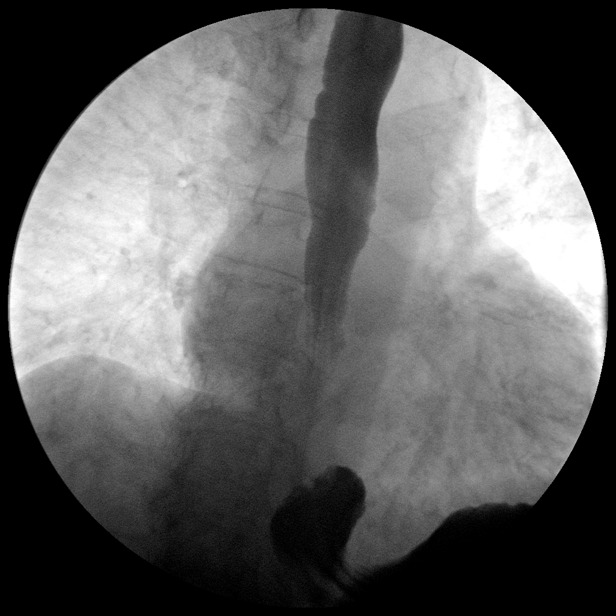

[Series 10: run · 1 of 1 slices shown (8 of 9)]
[im 1/1]
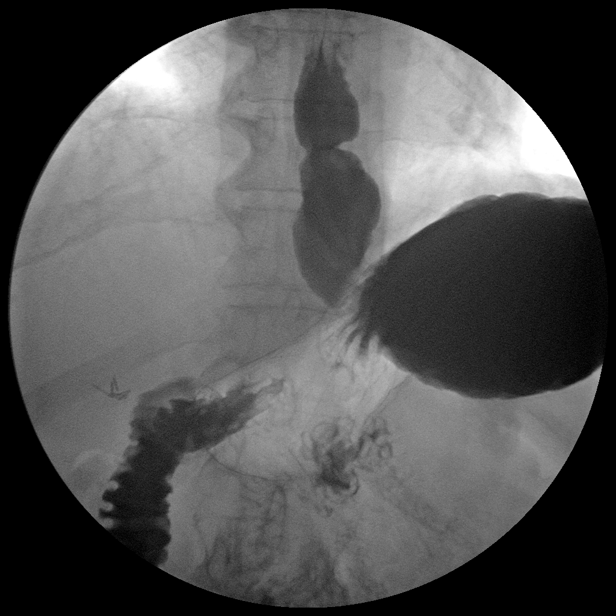

[Series 11: run · 1 of 1 slices shown (9 of 9)]
[im 1/1]
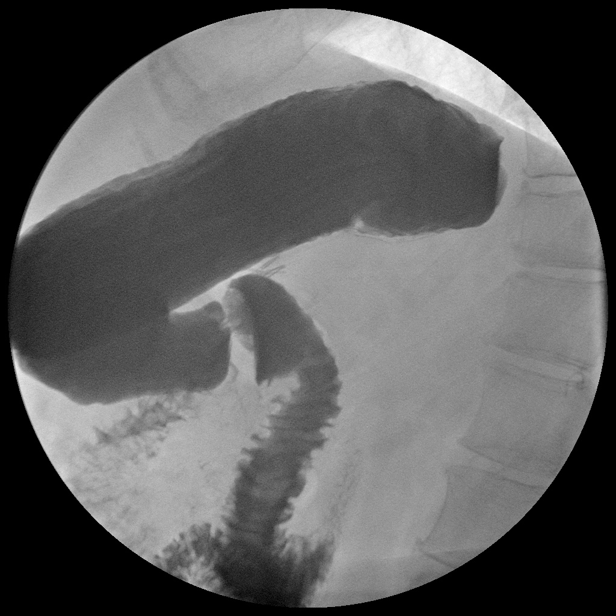

[Series 1001: view not recorded · 0.20mm/px · 1 of 1 slices shown (1 of 6)]
[im 1/1]
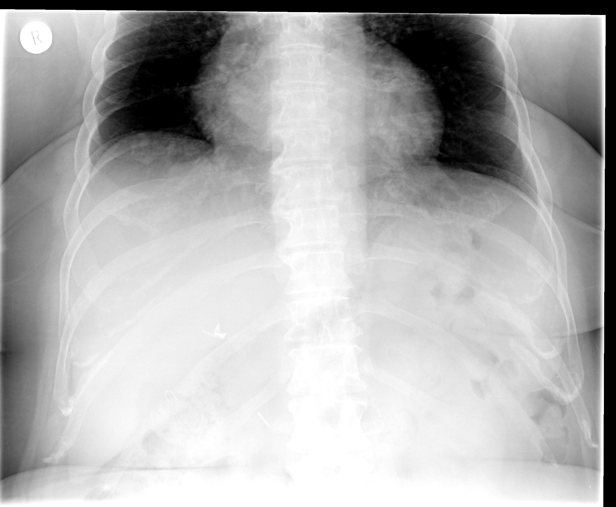

[Series 1002: view not recorded · 0.20mm/px · 1 of 1 slices shown (2 of 6)]
[im 1/1]
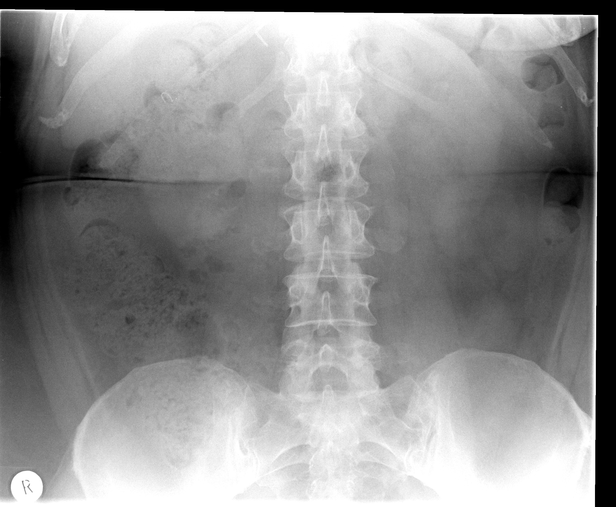

[Series 1003: view not recorded · 0.20mm/px · 1 of 1 slices shown (3 of 6)]
[im 1/1]
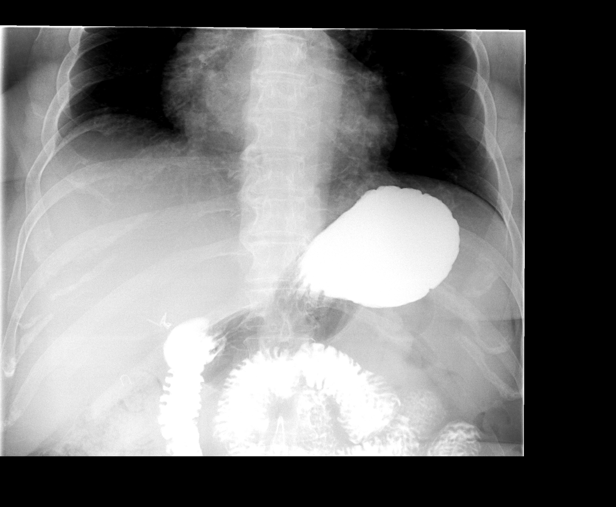

[Series 1004: view not recorded · 0.20mm/px · 1 of 1 slices shown (4 of 6)]
[im 1/1]
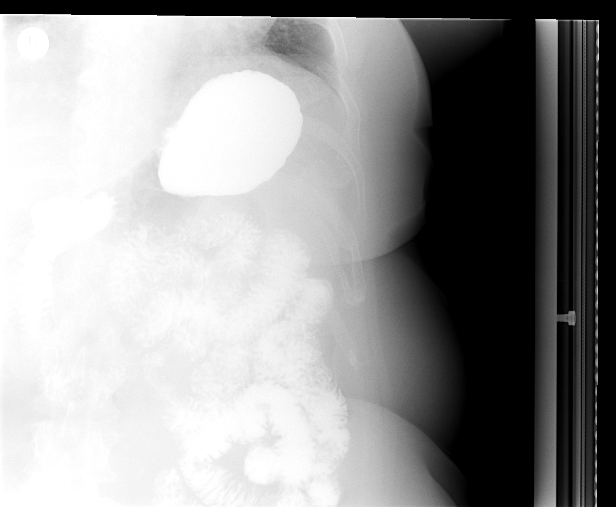

[Series 1006: view not recorded · 0.20mm/px · 1 of 1 slices shown (5 of 6)]
[im 1/1]
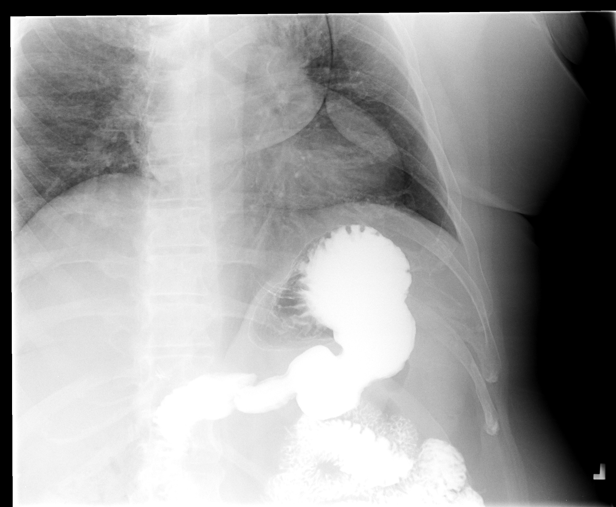

[Series 1007: view not recorded · 0.20mm/px · 1 of 1 slices shown (6 of 6)]
[im 1/1]
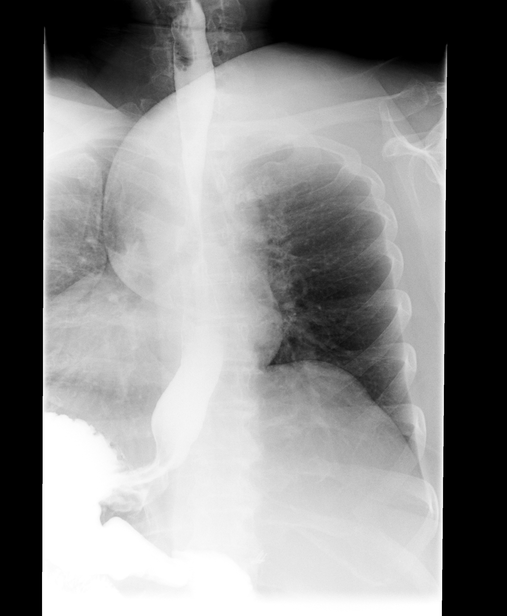

[15 of 19 positions shown; findings below may reference images not displayed]

FINDINGS: KUB:  A nonobstructive bowel gas pattern is seen.  No
abnormal calcifications or focal bony abnormalities are seen.
Surgical clips are identified in the right upper quadrant.

Upper GI: The patient was able to swallow barium without
difficulty.  The esophageal mucosa and motility were within normal
limits.  A small sliding variety hiatal hernia was noted.  Several
episodes of gastroesophageal reflux to the level of the high
cervical esophagus were noted with changes in positioning over the
course of the exam.  The column of contrast cleared at moderate
speed.

Gastric mucosa and motility appeared within normal limits.  The
duodenal bulb was well distended and has a normal morphology.
Visualized small bowel to the level of the proximal jejunum
appeared within normal limits.
IMPRESSION: High gastroesophageal reflux associated with a small sliding
variety hiatal hernia.  The reflux column showed moderate speed of
clearing.  Otherwise unremarkable.

## 2016-09-10 ENCOUNTER — Encounter (HOSPITAL_COMMUNITY): Payer: Self-pay

## 2017-09-10 ENCOUNTER — Encounter (HOSPITAL_COMMUNITY): Payer: Self-pay
